# Patient Record
Sex: Female | Born: 1935 | Race: Black or African American | Hispanic: No | Marital: Married | Smoking: Never smoker
Health system: Southern US, Community
[De-identification: ages and names within clinical notes are randomized; demographics above are authoritative.]

## PROBLEM LIST (undated history)

## (undated) DIAGNOSIS — I10 Essential (primary) hypertension: Secondary | ICD-10-CM

## (undated) DIAGNOSIS — M1990 Unspecified osteoarthritis, unspecified site: Secondary | ICD-10-CM

## (undated) DIAGNOSIS — I509 Heart failure, unspecified: Secondary | ICD-10-CM

## (undated) DIAGNOSIS — E119 Type 2 diabetes mellitus without complications: Secondary | ICD-10-CM

## (undated) HISTORY — PX: ABDOMINAL HYSTERECTOMY: SHX81

## (undated) HISTORY — PX: BUNIONECTOMY: SHX129

## (undated) HISTORY — PX: TONSILLECTOMY: SUR1361

## (undated) HISTORY — PX: ROTATOR CUFF REPAIR: SHX139

## (undated) HISTORY — PX: CARPAL TUNNEL RELEASE: SHX101

## (undated) HISTORY — PX: EYE SURGERY: SHX253

## (undated) HISTORY — PX: KNEE ARTHROSCOPY: SUR90

---

## 1998-07-31 ENCOUNTER — Encounter: Payer: Self-pay | Admitting: Ophthalmology

## 1998-08-03 ENCOUNTER — Ambulatory Visit (HOSPITAL_COMMUNITY): Admission: RE | Admit: 1998-08-03 | Discharge: 1998-08-03 | Payer: Self-pay | Admitting: Ophthalmology

## 1998-08-11 ENCOUNTER — Ambulatory Visit: Admission: RE | Admit: 1998-08-11 | Discharge: 1998-08-11 | Payer: Self-pay | Admitting: Family Medicine

## 1998-08-11 ENCOUNTER — Encounter: Payer: Self-pay | Admitting: Family Medicine

## 1999-01-11 ENCOUNTER — Ambulatory Visit (HOSPITAL_COMMUNITY): Admission: RE | Admit: 1999-01-11 | Discharge: 1999-01-11 | Payer: Self-pay | Admitting: Ophthalmology

## 1999-10-25 ENCOUNTER — Encounter: Admission: RE | Admit: 1999-10-25 | Discharge: 1999-10-25 | Payer: Self-pay | Admitting: Family Medicine

## 1999-10-25 ENCOUNTER — Encounter: Payer: Self-pay | Admitting: Family Medicine

## 2000-10-25 ENCOUNTER — Encounter: Admission: RE | Admit: 2000-10-25 | Discharge: 2000-10-25 | Payer: Self-pay | Admitting: Family Medicine

## 2000-10-25 ENCOUNTER — Encounter: Payer: Self-pay | Admitting: Family Medicine

## 2001-08-20 ENCOUNTER — Encounter: Payer: Self-pay | Admitting: Family Medicine

## 2001-08-20 ENCOUNTER — Encounter: Admission: RE | Admit: 2001-08-20 | Discharge: 2001-08-20 | Payer: Self-pay | Admitting: Family Medicine

## 2001-10-30 ENCOUNTER — Encounter: Admission: RE | Admit: 2001-10-30 | Discharge: 2001-10-30 | Payer: Self-pay | Admitting: Family Medicine

## 2001-10-30 ENCOUNTER — Encounter: Payer: Self-pay | Admitting: Family Medicine

## 2004-04-20 ENCOUNTER — Other Ambulatory Visit: Admission: RE | Admit: 2004-04-20 | Discharge: 2004-04-20 | Payer: Self-pay | Admitting: Family Medicine

## 2004-05-19 ENCOUNTER — Ambulatory Visit (HOSPITAL_COMMUNITY): Admission: RE | Admit: 2004-05-19 | Discharge: 2004-05-19 | Payer: Self-pay | Admitting: Gastroenterology

## 2004-05-19 ENCOUNTER — Encounter (INDEPENDENT_AMBULATORY_CARE_PROVIDER_SITE_OTHER): Payer: Self-pay | Admitting: Specialist

## 2008-12-30 ENCOUNTER — Emergency Department (HOSPITAL_COMMUNITY): Admission: EM | Admit: 2008-12-30 | Discharge: 2008-12-30 | Payer: Self-pay | Admitting: Emergency Medicine

## 2011-02-11 NOTE — Op Note (Signed)
NAME:  Catherine Vazquez, Catherine Vazquez                       ACCOUNT NO.:  [id number]   MEDICAL RECORD NO.:  [id number]                   PATIENT TYPE:  AMB   LOCATION:  ENDO                                 FACILITY:  [facility]   PHYSICIAN:  Anselmo Rod, M.D.               DATE OF BIRTH:  19-Feb-1925   DATE OF PROCEDURE:  05/19/2004  DATE OF DISCHARGE:                                 OPERATIVE REPORT   PROCEDURE:  Colonoscopy with snare polypectomy times one.   ENDOSCOPIST:  Anselmo Rod, M.D.   INSTRUMENT USED:  Olympus video colonoscope.   INDICATIONS FOR PROCEDURE:  This is a 77 year old African-American female  who is undergoing screening colonoscopy to rule out colonic polyps, masses,  etc.   PRE-PROCEDURE PREPARATION:  Informed consent was procured from the patient.  The patient fasted for eight hours prior to the procedure and prepped with a  bottle of magnesium citrate and a gallon of GoLYTELY the night prior to the  procedure.   PRE-PROCEDURE PHYSICAL:  VITAL SIGNS:  The patient has stable vital signs.  NECK:  Supple.  CHEST:  Clear to auscultation.  CARDIOVASCULAR:  S1 and S2 are regular.  ABDOMEN:  Soft with normal bowel sounds.   DESCRIPTION OF PROCEDURE:  The patient was placed in the left lateral  decubitus position and sedated with 60 mg of Demerol and 6 mg of Versed in  slow incremental doses. Once the patient was adequately sedated and  maintained on low flow oxygen and continuous cardiac monitoring, the Olympus  video colonoscope was advanced from the rectum to the cecum.  There was some  residual stool in the colon and multiple washings were done.  The  appendiceal orifice and the ileocecal valve were clearly visualized and  photographed. A flat polyp was snared from the mid right colon.  Prominent  internal hemorrhoids were seen on retroflexion.  There was some extrinsic  compression of the sigmoid, question of fibroids.   IMPRESSION:  1. Prominent internal  hemorrhoids.  2. Extrinsic compression of the rectosigmoid colon, question fibroids.  3. Flat polyp snared from the mid right colon.  4. Normal appearing transverse colon and cecum.   RECOMMENDATIONS:  1. Await pathology results.  2. Avoid non-steroidals including aspirin for the next four weeks.  3. Question of CT scan of the abdomen and pelvis to further evaluate the     abnormality noted in the rectosigmoid colon and extrinsic compression.  4. Outpatient follow-up in the next two weeks for further recommendations.                                               Anselmo Rod, M.D.    JNM/MEDQ  D:  05/19/2004  T:  05/19/2004  Job:  [id number]   cc:  Renaye Rakers, M.D.  [phone] [address]  [place]  [place] [address]  Fax: [phone]

## 2013-09-11 ENCOUNTER — Observation Stay (HOSPITAL_COMMUNITY)
Admission: EM | Admit: 2013-09-11 | Discharge: 2013-09-13 | Disposition: A | Discharge status: Home / Self Care | Payer: Medicare PPO | Attending: Internal Medicine | Admitting: Internal Medicine

## 2013-09-11 ENCOUNTER — Emergency Department (HOSPITAL_COMMUNITY): Payer: Medicare PPO

## 2013-09-11 ENCOUNTER — Encounter (HOSPITAL_COMMUNITY): Payer: Self-pay | Admitting: Emergency Medicine

## 2013-09-11 DIAGNOSIS — M129 Arthropathy, unspecified: Secondary | ICD-10-CM

## 2013-09-11 DIAGNOSIS — I10 Essential (primary) hypertension: Secondary | ICD-10-CM | POA: Diagnosis present

## 2013-09-11 DIAGNOSIS — R5383 Other fatigue: Secondary | ICD-10-CM | POA: Diagnosis present

## 2013-09-11 DIAGNOSIS — Z79899 Other long term (current) drug therapy: Secondary | ICD-10-CM | POA: Insufficient documentation

## 2013-09-11 DIAGNOSIS — M1990 Unspecified osteoarthritis, unspecified site: Secondary | ICD-10-CM | POA: Diagnosis present

## 2013-09-11 DIAGNOSIS — I5030 Unspecified diastolic (congestive) heart failure: Secondary | ICD-10-CM | POA: Diagnosis present

## 2013-09-11 DIAGNOSIS — R0609 Other forms of dyspnea: Principal | ICD-10-CM | POA: Diagnosis present

## 2013-09-11 DIAGNOSIS — R42 Dizziness and giddiness: Secondary | ICD-10-CM | POA: Insufficient documentation

## 2013-09-11 DIAGNOSIS — R5381 Other malaise: Secondary | ICD-10-CM | POA: Insufficient documentation

## 2013-09-11 DIAGNOSIS — R0989 Other specified symptoms and signs involving the circulatory and respiratory systems: Principal | ICD-10-CM | POA: Insufficient documentation

## 2013-09-11 HISTORY — DX: Unspecified osteoarthritis, unspecified site: M19.90

## 2013-09-11 LAB — BASIC METABOLIC PANEL
CO2: 29 mEq/L (ref 19–32)
Chloride: 106 mEq/L (ref 96–112)
Creatinine, Ser: 0.8 mg/dL (ref 0.50–1.10)
GFR calc Af Amer: 75 mL/min — ABNORMAL LOW (ref >90)
Glucose, Bld: 79 mg/dL (ref 70–99)
Potassium: 4.1 mEq/L (ref 3.5–5.1)

## 2013-09-11 LAB — CBC
HCT: 38.7 % (ref 36.0–46.0)
Hemoglobin: 13.3 g/dL (ref 12.0–15.0)
MCH: 33.3 pg (ref 26.0–34.0)
MCV: 97 fL (ref 78.0–100.0)
Platelets: 228 10*3/uL (ref 150–400)
RBC: 3.99 MIL/uL (ref 3.87–5.11)
WBC: 6 10*3/uL (ref 4.0–10.5)

## 2013-09-11 LAB — D-DIMER, QUANTITATIVE: D-Dimer, Quant: 0.82 ug/mL-FEU — ABNORMAL HIGH (ref 0.00–0.48)

## 2013-09-11 LAB — POCT I-STAT TROPONIN I: Troponin i, poc: 0 ng/mL (ref 0.00–0.08)

## 2013-09-11 LAB — GLUCOSE, CAPILLARY: Glucose-Capillary: 62 mg/dL — ABNORMAL LOW (ref 70–99)

## 2013-09-11 LAB — PRO B NATRIURETIC PEPTIDE: Pro B Natriuretic peptide (BNP): 134.4 pg/mL (ref 0–450)

## 2013-09-11 MED ORDER — IOHEXOL 350 MG/ML SOLN
80.0000 mL | Freq: Once | INTRAVENOUS | Status: AC | PRN
  Administered 2013-09-11: 80 mL via INTRAVENOUS

## 2013-09-11 MED ORDER — SODIUM CHLORIDE 0.9 % IV BOLUS (SEPSIS)
500.0000 mL | Freq: Once | INTRAVENOUS | Status: AC
  Administered 2013-09-11: 500 mL via INTRAVENOUS

## 2013-09-11 NOTE — ED Notes (Signed)
Pt presents today with c/o SOB and dizziness getting progressively worse x 2 weeks.  Pt states "no energy whatsoever".

## 2013-09-11 NOTE — ED Notes (Signed)
Patient transported to CT 

## 2013-09-11 NOTE — ED Provider Notes (Signed)
CSN: [id number]     Arrival date & time 09/11/13  1440 History   First MD Initiated Contact with Patient 09/11/13 1759     Chief Complaint  Patient presents with  . Shortness of Breath  . Dizziness   (Consider location/radiation/quality/duration/timing/severity/associated sxs/prior Treatment) HPI  This is an 79 yo with past medical history notable for arthritis who presents with shortness of breath and dizziness. Patient reports progressive shortness of breath in the last 2 weeks. She reports dyspnea on exertion but no orthopnea. She denies any chest pain during this time. She reports decreased energy level. She denies any fevers or cough. She denies any leg swelling or history of blood clots. Patient states that she feels lightheaded but denies any room spinning dizziness. She denies any focal weakness or numbness. She states that if she is lying still she feels "pretty good."  Past Medical History  Diagnosis Date  . Arthritis    Past Surgical History  Procedure Laterality Date  . Abdominal hysterectomy    . Tonsillectomy    . Bunionectomy    . Carpal tunnel release    . Rotator cuff repair    . Knee arthroscopy    . Eye surgery     No family history on file. History  Substance Use Topics  . Smoking status: Never Smoker   . Smokeless tobacco: Not on file  . Alcohol Use: No   OB History   Grav Para Term Preterm Abortions TAB SAB Ect Mult Living                 Review of Systems  Constitutional: Negative for fever.  Respiratory: Positive for shortness of breath. Negative for cough and chest tightness.   Cardiovascular: Negative for chest pain and leg swelling.  Gastrointestinal: Negative for nausea, vomiting and abdominal pain.  Genitourinary: Negative for dysuria.  Musculoskeletal: Negative for back pain.  Skin: Negative for wound.  Neurological: Positive for dizziness, weakness and light-headedness. Negative for headaches.  Psychiatric/Behavioral: Negative for  confusion.  All other systems reviewed and are negative.    Allergies  Review of patient's allergies indicates no known allergies.  Home Medications   Current Outpatient Rx  Name  Route  Sig  Dispense  Refill  . cyanocobalamin 1000 MCG tablet   Oral   Take 100 mcg by mouth daily.         . Diclofenac-Misoprostol 75-0.2 MG TBEC   Oral   Take 1 tablet by mouth 2 (two) times daily.         . Misc Natural Products (OSTEO BI-FLEX/5-LOXIN ADVANCED PO)   Oral   Take 1 tablet by mouth daily.         . Multiple Vitamins-Minerals (CENTRUM SILVER PO)   Oral   Take 1 tablet by mouth daily.          BP 157/81  Pulse 64  Temp(Src) 98.4 F (36.9 C) (Oral)  Resp 18  Ht 5\' 5"  (1.651 m)  Wt 169 lb (76.658 kg)  BMI 28.12 kg/m2  SpO2 99% Physical Exam  Nursing note and vitals reviewed. Constitutional: She is oriented to person, place, and time. She appears well-developed and well-nourished. No distress.  Elderly  HENT:  Head: Normocephalic and atraumatic.  Mouth/Throat: Oropharynx is clear and moist.  Eyes: EOM are normal. Pupils are equal, round, and reactive to light.  Neck: Neck supple.  Cardiovascular: Normal rate, regular rhythm and normal heart sounds.   No murmur heard. Pulmonary/Chest: Effort normal  and breath sounds normal. No respiratory distress. She has no wheezes. She has no rales.  Abdominal: Soft. There is no tenderness.  Musculoskeletal: She exhibits no edema.  Neurological: She is alert and oriented to person, place, and time.  5 out of 5 strength in all 4 extremities, no dysmetria noted, cranial nerves II through XII intact  Skin: Skin is warm and dry.  Psychiatric: She has a normal mood and affect.    ED Course  Procedures (including critical care time) Labs Review Labs Reviewed  BASIC METABOLIC PANEL - Abnormal; Notable for the following:    GFR calc non Af Amer 65 (*)    GFR calc Af Amer 75 (*)    All other components within normal limits   GLUCOSE, CAPILLARY - Abnormal; Notable for the following:    Glucose-Capillary 62 (*)    All other components within normal limits  D-DIMER, QUANTITATIVE - Abnormal; Notable for the following:    D-Dimer, Quant 0.82 (*)    All other components within normal limits  CBC  PRO B NATRIURETIC PEPTIDE  GLUCOSE, CAPILLARY  POCT I-STAT TROPONIN I   Imaging Review Dg Chest 2 View (if Patient Has Fever And/or Copd)  09/11/2013   CLINICAL DATA:  Shortness of breath, dizziness  EXAM: CHEST  2 VIEW  COMPARISON:  None.  FINDINGS: Cardiomediastinal silhouette is unremarkable. No acute infiltrate or pulmonary edema. Degenerative changes thoracic spine.  IMPRESSION: No active disease.  Degenerative changes thoracic spine.   Electronically Signed   By: Natasha Mead M.D.   On: 09/11/2013 16:22   Ct Head Wo Contrast  09/11/2013   CLINICAL DATA:  Shortness of breath, dizziness  EXAM: CT HEAD WITHOUT CONTRAST  TECHNIQUE: Contiguous axial images were obtained from the base of the skull through the vertex without intravenous contrast.  COMPARISON:  None.  FINDINGS: No skull fracture is noted. Paranasal sinuses are unremarkable. There is partial opacification of left mastoid air cells.  No intracranial hemorrhage, mass effect or midline shift. Atherosclerotic calcifications of carotid siphon. Mild cerebral atrophy. Periventricular and patchy subcortical white matter decreased attenuation probable due to chronic small vessel ischemic changes. No definite acute cortical infarction. Probable small lacunar infarct in right frontal periventricular white matter. No mass lesion is noted on this unenhanced scan.  IMPRESSION: No acute intracranial abnormality. Mild cerebral atrophy. Periventricular and patchy subcortical white matter decreased attenuation probable due to chronic small vessel ischemic changes. No definite acute cortical infarction. Probable small lacunar infarct in right frontal periventricular white matter.    Electronically Signed   By: Natasha Mead M.D.   On: 09/11/2013 18:42   Ct Angio Chest W/cm &/or Wo Cm  09/11/2013   CLINICAL DATA:  Shortness of breath, dizziness  EXAM: CT ANGIOGRAPHY CHEST WITH CONTRAST  TECHNIQUE: Multidetector CT imaging of the chest was performed using the standard protocol during bolus administration of intravenous contrast. Multiplanar CT image reconstructions including MIPs were obtained to evaluate the vascular anatomy.  CONTRAST:  80mL OMNIPAQUE IOHEXOL 350 MG/ML SOLN  COMPARISON:  None  FINDINGS: Visualized portion of upper abdomen unremarkable.  Aorta normal caliber when height aneurysm or dissection.  Pulmonary arteries patent.  No evidence of pulmonary embolism.  No thoracic adenopathy.  Scattered interstitial thickening and mosaic attenuation in the lungs with dependent atelectasis at the lower lobes.  No pleural effusion, pneumothorax or pulmonary mass/ nodule.  Scattered degenerative disc disease changes thoracic spine without acute osseous findings.  Review of the MIP images confirms the  above findings.  IMPRESSION: No evidence of pulmonary embolism.  Scattered atelectasis, interstitial thickening, and mosaic attenuation in both lungs, nonspecific.   Electronically Signed   By: Ulyses Southward M.D.   On: 09/11/2013 21:00    EKG Interpretation    Date/Time:  Wednesday September 11 2013 14:47:38 EST Ventricular Rate:  68 PR Interval:  216 QRS Duration: 126 QT Interval:  398 QTC Calculation: 423 R Axis:   69 Text Interpretation:  Sinus rhythm with 1st degree A-V block Right bundle branch block New since previous tracing Abnormal ECG Confirmed by Javaun Dimperio  MD, Buster Schueller ([id number]) on 09/11/2013 9:53:37 PM            MDM   1. Dyspnea on exertion   2. Arthritis   3. Fatigue   4. HBP (high blood pressure)    Patient presents with dyspnea on exertion. She is nontoxic-appearing on exam. Pulmonary exam is reassuring. The patient has not had any chest pain. Labwork was  initiated including a d-dimer. She is low risk but given her age she does not meet per criteria. Number was elevated. CT scan of the chest is negative for clot. Patient's troponin is negative. EKG shows a right bundle branch block which is new from prior. Other workup is negative. Have concerns that patient's dyspnea on exertion may be an anginal equivalent. Discussed with the hospitalist who will admit for cardiac rule out.    Shon Baton, MD 09/12/13 [phone]

## 2013-09-11 NOTE — ED Notes (Signed)
Gave pt a cup of orange juice to drink. Will recheck blood sugar once pt drinks orange juice.

## 2013-09-11 NOTE — ED Notes (Signed)
02 dropped to 88% while walking. HR stayed in the 70.

## 2013-09-11 NOTE — ED Notes (Signed)
CBG 86. 

## 2013-09-12 DIAGNOSIS — I10 Essential (primary) hypertension: Secondary | ICD-10-CM | POA: Diagnosis present

## 2013-09-12 DIAGNOSIS — M1990 Unspecified osteoarthritis, unspecified site: Secondary | ICD-10-CM | POA: Diagnosis present

## 2013-09-12 DIAGNOSIS — R5383 Other fatigue: Secondary | ICD-10-CM | POA: Diagnosis present

## 2013-09-12 LAB — GLUCOSE, CAPILLARY
Glucose-Capillary: 88 mg/dL (ref 70–99)
Glucose-Capillary: 119 mg/dL — ABNORMAL HIGH (ref 70–99)

## 2013-09-12 LAB — TROPONIN I
Troponin I: <0.3 ng/mL (ref <0.30)
Troponin I: <0.3 ng/mL (ref <0.30)

## 2013-09-12 MED ORDER — ASPIRIN EC 325 MG PO TBEC
325.0000 mg | DELAYED_RELEASE_TABLET | Freq: Every day | ORAL | Status: DC
  Administered 2013-09-12 – 2013-09-13 (×2): 325 mg via ORAL
  Filled 2013-09-12 (×2): qty 1

## 2013-09-12 MED ORDER — ONDANSETRON HCL 4 MG/2ML IJ SOLN: 4.0000 mg | Freq: Four times a day (QID) | INTRAMUSCULAR | Status: DC | PRN

## 2013-09-12 MED ORDER — ACETAMINOPHEN 325 MG PO TABS: 650.0000 mg | ORAL_TABLET | ORAL | Status: DC | PRN

## 2013-09-12 NOTE — H&P (Signed)
PCP:   Altamese [place], MD   Chief Complaint:  Sob  HPI: 79 yo pleasant female comes in with 2 weeks of worsening doe.  Has been feeling very tired and worn out lately, and gets short of breath with any activity.  No le edema or swelling.  No fevers.  No cough.  No cp.  No recent uri symptoms.  No n/v/d.  Gets dizzy sometimes but loc.  No prior h/o chf but says shes had a heart attack before.  On minimal meds at home.  Review of Systems:  Positive and negative as per HPI otherwise all other systems are negative  Past Medical History: Past Medical History  Diagnosis Date  . Arthritis    Past Surgical History  Procedure Laterality Date  . Abdominal hysterectomy    . Tonsillectomy    . Bunionectomy    . Carpal tunnel release    . Rotator cuff repair    . Knee arthroscopy    . Eye surgery      Medications: Prior to Admission medications   Medication Sig Start Date End Date Taking? Authorizing Provider  cyanocobalamin 1000 MCG tablet Take 100 mcg by mouth daily.   Yes Historical Provider, MD  Diclofenac-Misoprostol 75-0.2 MG TBEC Take 1 tablet by mouth 2 (two) times daily.   Yes Historical Provider, MD  Misc Natural Products (OSTEO BI-FLEX/5-LOXIN ADVANCED PO) Take 1 tablet by mouth daily.   Yes Historical Provider, MD  Multiple Vitamins-Minerals (CENTRUM SILVER PO) Take 1 tablet by mouth daily.   Yes Historical Provider, MD    Allergies:  No Known Allergies  Social History:  reports that she has never smoked. She does not have any smokeless tobacco history on file. She reports that she does not drink alcohol or use illicit drugs.  Family History: none  Physical Exam: Filed Vitals:   09/11/13 2200 09/11/13 2215 09/11/13 2230 09/11/13 2315  BP: 155/61 152/68 165/67 157/81  Pulse: 65 46 58 64  Temp:      TempSrc:      Resp:      Height:      Weight:      SpO2: 100% 94% 100% 99%   General appearance: alert, cooperative and no distress Head: Normocephalic, without  obvious abnormality, atraumatic Eyes: negative Nose: Nares normal. Septum midline. Mucosa normal. No drainage or sinus tenderness. Neck: no JVD and supple, symmetrical, trachea midline Lungs: clear to auscultation bilaterally Heart: regular rate and rhythm, S1, S2 normal, no murmur, click, rub or gallop Abdomen: soft, non-tender; bowel sounds normal; no masses,  no organomegaly Extremities: extremities normal, atraumatic, no cyanosis or edema Pulses: 2+ and symmetric Skin: Skin color, texture, turgor normal. No rashes or lesions Neurologic: Grossly normal    Labs on Admission:   Recent Labs  09/11/13 1500  NA 144  K 4.1  CL 106  CO2 29  GLUCOSE 79  BUN 19  CREATININE 0.80  CALCIUM 9.6    Recent Labs  09/11/13 1500  WBC 6.0  HGB 13.3  HCT 38.7  MCV 97.0  PLT 228    Radiological Exams on Admission: Dg Chest 2 View (if Patient Has Fever And/or Copd)  09/11/2013   CLINICAL DATA:  Shortness of breath, dizziness  EXAM: CHEST  2 VIEW  COMPARISON:  None.  FINDINGS: Cardiomediastinal silhouette is unremarkable. No acute infiltrate or pulmonary edema. Degenerative changes thoracic spine.  IMPRESSION: No active disease.  Degenerative changes thoracic spine.   Electronically Signed   By: Lang Snow  Pop M.D.   On: 09/11/2013 16:22   Ct Head Wo Contrast  09/11/2013   CLINICAL DATA:  Shortness of breath, dizziness  EXAM: CT HEAD WITHOUT CONTRAST  TECHNIQUE: Contiguous axial images were obtained from the base of the skull through the vertex without intravenous contrast.  COMPARISON:  None.  FINDINGS: No skull fracture is noted. Paranasal sinuses are unremarkable. There is partial opacification of left mastoid air cells.  No intracranial hemorrhage, mass effect or midline shift. Atherosclerotic calcifications of carotid siphon. Mild cerebral atrophy. Periventricular and patchy subcortical white matter decreased attenuation probable due to chronic small vessel ischemic changes. No definite  acute cortical infarction. Probable small lacunar infarct in right frontal periventricular white matter. No mass lesion is noted on this unenhanced scan.  IMPRESSION: No acute intracranial abnormality. Mild cerebral atrophy. Periventricular and patchy subcortical white matter decreased attenuation probable due to chronic small vessel ischemic changes. No definite acute cortical infarction. Probable small lacunar infarct in right frontal periventricular white matter.   Electronically Signed   By: Natasha Mead M.D.   On: 09/11/2013 18:42   Ct Angio Chest W/cm &/or Wo Cm  09/11/2013   CLINICAL DATA:  Shortness of breath, dizziness  EXAM: CT ANGIOGRAPHY CHEST WITH CONTRAST  TECHNIQUE: Multidetector CT imaging of the chest was performed using the standard protocol during bolus administration of intravenous contrast. Multiplanar CT image reconstructions including MIPs were obtained to evaluate the vascular anatomy.  CONTRAST:  80mL OMNIPAQUE IOHEXOL 350 MG/ML SOLN  COMPARISON:  None  FINDINGS: Visualized portion of upper abdomen unremarkable.  Aorta normal caliber when height aneurysm or dissection.  Pulmonary arteries patent.  No evidence of pulmonary embolism.  No thoracic adenopathy.  Scattered interstitial thickening and mosaic attenuation in the lungs with dependent atelectasis at the lower lobes.  No pleural effusion, pneumothorax or pulmonary mass/ nodule.  Scattered degenerative disc disease changes thoracic spine without acute osseous findings.  Review of the MIP images confirms the above findings.  IMPRESSION: No evidence of pulmonary embolism.  Scattered atelectasis, interstitial thickening, and mosaic attenuation in both lungs, nonspecific.   Electronically Signed   By: Ulyses Southward M.D.   On: 09/11/2013 21:00    Assessment/Plan  79 yo female with doe of unclear etiology  Principal Problem:   DOE (dyspnea on exertion)- could be anginal equivilant.  obs for romi.  ekg nsr no acute changes.  Obtain 2 D  echo in am.  Will need further outpt stress testing.  Asa.  No evidence of pna or chf at this time.  No history to indicate underlying pulmonology source, so will do cardiac w/u first.  No h/o htn but multiple elevated bp here.  Active Problems:   Fatigue   Arthritis   HBP (high blood pressure)    Nochum Fenter A 09/12/2013, 12:29 AM

## 2013-09-12 NOTE — Progress Notes (Addendum)
[facility] PROGRESS NOTE  Catherine Vazquez GNF:[id number] DOB: 09-Sep-1925 DOA: 09/11/2013 PCP: Altamese [place], MD  Assessment/Plan: DOE (dyspnea on exertion)- ? anginal equivilant.  -ekg nsr no acute changes -CE  -2 D echo in am.  -? further outpt stress testing.  -Asa.  -check TSH  HTN -d/c in AM   Code Status: full Family Communication: daughter at bedside Disposition Plan: home in AM   Consultants:  none  Procedures:  echo  Antibiotics:    HPI/Subjective: Feeling better Wants to go home  Objective: Filed Vitals:   09/12/13 0452  BP: 146/54  Pulse: 59  Temp: 97.6 F (36.4 C)  Resp: 16    Intake/Output Summary (Last 24 hours) at 09/12/13 1220 Last data filed at 09/12/13 0900  Gross per 24 hour  Intake    240 ml  Output    275 ml  Net    -35 ml   Filed Weights   09/11/13 1457 09/12/13 0129  Weight: 76.658 kg (169 lb) 76.159 kg (167 lb 14.4 oz)    Exam:   General:  A+Ox3, NAD   Cardiovascular: rrr  Respiratory: clear anterior, no wheezing  Abdomen: +BS, soft, NT  Musculoskeletal: moves all 4 ext   Data Reviewed: Basic Metabolic Panel:  Recent Labs Lab 09/11/13 1500  NA 144  K 4.1  CL 106  CO2 29  GLUCOSE 79  BUN 19  CREATININE 0.80  CALCIUM 9.6   Liver Function Tests: No results found for this basename: AST, ALT, ALKPHOS, BILITOT, PROT, ALBUMIN,  in the last 168 hours No results found for this basename: LIPASE, AMYLASE,  in the last 168 hours No results found for this basename: AMMONIA,  in the last 168 hours CBC:  Recent Labs Lab 09/11/13 1500  WBC 6.0  HGB 13.3  HCT 38.7  MCV 97.0  PLT 228   Cardiac Enzymes:  Recent Labs Lab 09/12/13 0205 09/12/13 0805  TROPONINI <0.30 <0.30   BNP (last 3 results)  Recent Labs  09/11/13 1844  PROBNP 134.4   CBG:  Recent Labs Lab 09/11/13 1808 09/11/13 1911 09/12/13 0134 09/12/13 0751 09/12/13 1140  GLUCAP 62* 86 173* 88 103*    No results found  for this or any previous visit (from the past 240 hour(s)).   Studies: Dg Chest 2 View (if Patient Has Fever And/or Copd)  09/11/2013   CLINICAL DATA:  Shortness of breath, dizziness  EXAM: CHEST  2 VIEW  COMPARISON:  None.  FINDINGS: Cardiomediastinal silhouette is unremarkable. No acute infiltrate or pulmonary edema. Degenerative changes thoracic spine.  IMPRESSION: No active disease.  Degenerative changes thoracic spine.   Electronically Signed   By: Natasha Mead M.D.   On: 09/11/2013 16:22   Ct Head Wo Contrast  09/11/2013   CLINICAL DATA:  Shortness of breath, dizziness  EXAM: CT HEAD WITHOUT CONTRAST  TECHNIQUE: Contiguous axial images were obtained from the base of the skull through the vertex without intravenous contrast.  COMPARISON:  None.  FINDINGS: No skull fracture is noted. Paranasal sinuses are unremarkable. There is partial opacification of left mastoid air cells.  No intracranial hemorrhage, mass effect or midline shift. Atherosclerotic calcifications of carotid siphon. Mild cerebral atrophy. Periventricular and patchy subcortical white matter decreased attenuation probable due to chronic small vessel ischemic changes. No definite acute cortical infarction. Probable small lacunar infarct in right frontal periventricular white matter. No mass lesion is noted on this unenhanced scan.  IMPRESSION: No acute intracranial abnormality. Mild cerebral atrophy.  Periventricular and patchy subcortical white matter decreased attenuation probable due to chronic small vessel ischemic changes. No definite acute cortical infarction. Probable small lacunar infarct in right frontal periventricular white matter.   Electronically Signed   By: Natasha Mead M.D.   On: 09/11/2013 18:42   Ct Angio Chest W/cm &/or Wo Cm  09/11/2013   CLINICAL DATA:  Shortness of breath, dizziness  EXAM: CT ANGIOGRAPHY CHEST WITH CONTRAST  TECHNIQUE: Multidetector CT imaging of the chest was performed using the standard protocol  during bolus administration of intravenous contrast. Multiplanar CT image reconstructions including MIPs were obtained to evaluate the vascular anatomy.  CONTRAST:  80mL OMNIPAQUE IOHEXOL 350 MG/ML SOLN  COMPARISON:  None  FINDINGS: Visualized portion of upper abdomen unremarkable.  Aorta normal caliber when height aneurysm or dissection.  Pulmonary arteries patent.  No evidence of pulmonary embolism.  No thoracic adenopathy.  Scattered interstitial thickening and mosaic attenuation in the lungs with dependent atelectasis at the lower lobes.  No pleural effusion, pneumothorax or pulmonary mass/ nodule.  Scattered degenerative disc disease changes thoracic spine without acute osseous findings.  Review of the MIP images confirms the above findings.  IMPRESSION: No evidence of pulmonary embolism.  Scattered atelectasis, interstitial thickening, and mosaic attenuation in both lungs, nonspecific.   Electronically Signed   By: Ulyses Southward M.D.   On: 09/11/2013 21:00    Scheduled Meds: . aspirin EC  325 mg Oral Daily   Continuous Infusions:   Principal Problem:   DOE (dyspnea on exertion) Active Problems:   Fatigue   Arthritis   HBP (high blood pressure)    Time spent: 35 min    Catherine Vazquez  [facility] Pager [phone]. If 7PM-7AM, please contact night-coverage at [email or web address], password [facility] 09/12/2013, 12:20 PM  LOS: 1 day

## 2013-09-12 NOTE — Progress Notes (Signed)
Echocardiogram 2D Echocardiogram has been performed.  Catherine Vazquez 09/12/2013, 1:06 PM

## 2013-09-12 NOTE — Progress Notes (Signed)
UR completed 

## 2013-09-13 DIAGNOSIS — I5030 Unspecified diastolic (congestive) heart failure: Secondary | ICD-10-CM | POA: Diagnosis present

## 2013-09-13 DIAGNOSIS — I509 Heart failure, unspecified: Secondary | ICD-10-CM

## 2013-09-13 LAB — GLUCOSE, CAPILLARY: Glucose-Capillary: 104 mg/dL — ABNORMAL HIGH (ref 70–99)

## 2013-09-13 MED ORDER — FUROSEMIDE 20 MG PO TABS: 20.0000 mg | ORAL_TABLET | Freq: Every day | ORAL | Status: DC

## 2013-09-13 NOTE — Discharge Summary (Addendum)
Physician Discharge Summary  Catherine Vazquez:[id number] DOB: 01/29/1925 DOA: 09/11/2013  PCP: Altamese [place], MD  Admit date: 09/11/2013 Discharge date: 09/13/2013  Time spent:  Recommendations for Outpatient Follow-up:  1. Discharge home with outpatient PCP followup.  Discharge Diagnoses:  Principal Problem: Diastolic CHF     Active Problems: DOE (dyspnea on exertion)   Fatigue   Arthritis   HBP (high blood pressure)   Discharge Condition: Fair  Diet recommendation: Low sodium diet  Filed Weights   09/11/13 1457 09/12/13 0129 09/13/13 0649  Weight: 76.658 kg (169 lb) 76.159 kg (167 lb 14.4 oz) 75.796 kg (167 lb 1.6 oz)    History of present illness:  Please refer to admission H&P for details, but in brief, 79 year old female with history of arthritis presented with 2 weeks of worsening dyspnea on exertion which progressed to dyspnea with minimal activity. She denied any thick swellings, orthopnea or PND. Denies any fever, recent travel or chest pain symptoms. She denied any nausea vomiting or diarrhea. She did report feeling  dizzy as well. Patient admitted for further workup.  Hospital Course:  Dyspnea on exertion Symptoms likely related to grade 2 diastolic dysfunction seen on 2-D echo. She however did not have acute CHF symptoms on presentation. A proBNP was normal. Chest x-ray and CT angiogram of the chest (given elevated d-dimer) were both negative. Her symptoms have now improved. I will discharge her on low dose of Lasix 20 mg once daily and have her followup as outpatient. Her dizziness has now resolved. -i have ordered TSH which can be followed as outpatient.   Elevated blood pressure Her systolic blood pressure is mildly elevated. I am discharging her on low dose Lasix beginning of depression needs to be monitored as outpatient and if persistently elevated , her Lasix dose should be increased or started on another blood pressure  medication.  Arthritis Continue NSAIDs. Monitor renal fn while on both NSAIDs and lasix.  Patient is stable for discharge home with outpatient followup daughter-in-law  was informed over the form  Procedures:  2D echo  Consultations:  none  Discharge Exam: Filed Vitals:   09/13/13 0649  BP: 144/67  Pulse: 57  Temp: 97.8 F (36.6 C)  Resp:     General: Elderly female in no acute distress HEENT: No pallor, moist oral mucosa Chest: Clear to auscultation bilaterally, no added sounds CVS: Normal S1 and S2, no murmurs rub or gallop Abdomen: Soft, nontender, nondistended, bowel sounds present Extremities: Warm, no edema CNS: AAO x3  Discharge Instructions     Medication List         CENTRUM SILVER PO  Take 1 tablet by mouth daily.     cyanocobalamin 1000 MCG tablet  Take 100 mcg by mouth daily.     Diclofenac-Misoprostol 75-0.2 MG Tbec  Take 1 tablet by mouth 2 (two) times daily.     furosemide 20 MG tablet  Commonly known as:  LASIX  Take 1 tablet (20 mg total) by mouth daily.     OSTEO BI-FLEX/5-LOXIN ADVANCED PO  Take 1 tablet by mouth daily.       No Known Allergies     Follow-up Information   Follow up with Altamese [place], MD In 1 week.   Specialty:  Family Medicine   Contact information:   [address] [place] [address] [phone]        The results of significant diagnostics from this hospitalization (including imaging, microbiology, ancillary and laboratory)  are listed below for reference.    Significant Diagnostic Studies: Dg Chest 2 View (if Patient Has Fever And/or Copd)  09/11/2013   CLINICAL DATA:  Shortness of breath, dizziness  EXAM: CHEST  2 VIEW  COMPARISON:  None.  FINDINGS: Cardiomediastinal silhouette is unremarkable. No acute infiltrate or pulmonary edema. Degenerative changes thoracic spine.  IMPRESSION: No active disease.  Degenerative changes thoracic spine.   Electronically Signed   By: Natasha Mead  M.D.   On: 09/11/2013 16:22   Ct Head Wo Contrast  09/11/2013   CLINICAL DATA:  Shortness of breath, dizziness  EXAM: CT HEAD WITHOUT CONTRAST  TECHNIQUE: Contiguous axial images were obtained from the base of the skull through the vertex without intravenous contrast.  COMPARISON:  None.  FINDINGS: No skull fracture is noted. Paranasal sinuses are unremarkable. There is partial opacification of left mastoid air cells.  No intracranial hemorrhage, mass effect or midline shift. Atherosclerotic calcifications of carotid siphon. Mild cerebral atrophy. Periventricular and patchy subcortical white matter decreased attenuation probable due to chronic small vessel ischemic changes. No definite acute cortical infarction. Probable small lacunar infarct in right frontal periventricular white matter. No mass lesion is noted on this unenhanced scan.  IMPRESSION: No acute intracranial abnormality. Mild cerebral atrophy. Periventricular and patchy subcortical white matter decreased attenuation probable due to chronic small vessel ischemic changes. No definite acute cortical infarction. Probable small lacunar infarct in right frontal periventricular white matter.   Electronically Signed   By: Natasha Mead M.D.   On: 09/11/2013 18:42   Ct Angio Chest W/cm &/or Wo Cm  09/11/2013   CLINICAL DATA:  Shortness of breath, dizziness  EXAM: CT ANGIOGRAPHY CHEST WITH CONTRAST  TECHNIQUE: Multidetector CT imaging of the chest was performed using the standard protocol during bolus administration of intravenous contrast. Multiplanar CT image reconstructions including MIPs were obtained to evaluate the vascular anatomy.  CONTRAST:  80mL OMNIPAQUE IOHEXOL 350 MG/ML SOLN  COMPARISON:  None  FINDINGS: Visualized portion of upper abdomen unremarkable.  Aorta normal caliber when height aneurysm or dissection.  Pulmonary arteries patent.  No evidence of pulmonary embolism.  No thoracic adenopathy.  Scattered interstitial thickening and mosaic  attenuation in the lungs with dependent atelectasis at the lower lobes.  No pleural effusion, pneumothorax or pulmonary mass/ nodule.  Scattered degenerative disc disease changes thoracic spine without acute osseous findings.  Review of the MIP images confirms the above findings.  IMPRESSION: No evidence of pulmonary embolism.  Scattered atelectasis, interstitial thickening, and mosaic attenuation in both lungs, nonspecific.   Electronically Signed   By: Ulyses Southward M.D.   On: 09/11/2013 21:00    Microbiology: No results found for this or any previous visit (from the past 240 hour(s)).   Labs: Basic Metabolic Panel:  Recent Labs Lab 09/11/13 1500  NA 144  K 4.1  CL 106  CO2 29  GLUCOSE 79  BUN 19  CREATININE 0.80  CALCIUM 9.6   Liver Function Tests: No results found for this basename: AST, ALT, ALKPHOS, BILITOT, PROT, ALBUMIN,  in the last 168 hours No results found for this basename: LIPASE, AMYLASE,  in the last 168 hours No results found for this basename: AMMONIA,  in the last 168 hours CBC:  Recent Labs Lab 09/11/13 1500  WBC 6.0  HGB 13.3  HCT 38.7  MCV 97.0  PLT 228   Cardiac Enzymes:  Recent Labs Lab 09/12/13 0205 09/12/13 0805 09/12/13 1354  TROPONINI <0.30 <0.30 <0.30   BNP:  BNP (last 3 results)  Recent Labs  09/11/13 1844  PROBNP 134.4   CBG:  Recent Labs Lab 09/12/13 0751 09/12/13 1140 09/12/13 1625 09/12/13 2152 09/13/13 0759  GLUCAP 88 103* 119* 104* 104*       Signed:  Demitrios Molyneux  [facility] 09/13/2013, 11:10 AM

## 2015-02-08 ENCOUNTER — Emergency Department (HOSPITAL_COMMUNITY)
Admission: EM | Admit: 2015-02-08 | Discharge: 2015-02-08 | Disposition: A | Discharge status: Home / Self Care | Payer: Medicare PPO | Attending: Emergency Medicine | Admitting: Emergency Medicine

## 2015-02-08 ENCOUNTER — Encounter (HOSPITAL_COMMUNITY): Payer: Self-pay | Admitting: Emergency Medicine

## 2015-02-08 DIAGNOSIS — Z79899 Other long term (current) drug therapy: Secondary | ICD-10-CM | POA: Insufficient documentation

## 2015-02-08 DIAGNOSIS — M1990 Unspecified osteoarthritis, unspecified site: Secondary | ICD-10-CM | POA: Diagnosis not present

## 2015-02-08 DIAGNOSIS — H6093 Unspecified otitis externa, bilateral: Secondary | ICD-10-CM

## 2015-02-08 DIAGNOSIS — H9203 Otalgia, bilateral: Secondary | ICD-10-CM | POA: Diagnosis present

## 2015-02-08 MED ORDER — CIPROFLOXACIN-DEXAMETHASONE 0.3-0.1 % OT SUSP
4.0000 [drp] | Freq: Two times a day (BID) | OTIC | Status: AC
  Administered 2015-02-08: 4 [drp] via OTIC
  Filled 2015-02-08: qty 7.5

## 2015-02-08 MED ORDER — IBUPROFEN 800 MG PO TABS: 800.0000 mg | ORAL_TABLET | Freq: Three times a day (TID) | ORAL | Status: DC

## 2015-02-08 MED ORDER — AMOXICILLIN-POT CLAVULANATE 875-125 MG PO TABS: 1.0000 | ORAL_TABLET | Freq: Two times a day (BID) | ORAL | Status: DC

## 2015-02-08 NOTE — ED Provider Notes (Signed)
CSN: [id number]     Arrival date & time 02/08/15  1402 History   First MD Initiated Contact with Patient 02/08/15 1410     Chief Complaint  Patient presents with  . Otalgia  . Hearing Problem     (Consider location/radiation/quality/duration/timing/severity/associated sxs/prior Treatment) HPI Comments: 81 year old female, presents with bilateral ear pain right greater than left, has been ongoing for several weeks, gradually worsening, today her right ear pain was worse than the left, she has had a gradual decline in her ability to hear, she denies any fevers chills nausea vomiting vertigo tinnitus, neck pain, chest pain, shortness of breath or any other complaints. She has been dropping a "sweet oil" into her ear without improvement. She does clean her ears frequently and after using a Q-tip recently she noted a dark colored material that looked like "soot" coming out of her ear. There is no other ongoing drainage  Patient is a 81 y.o. female presenting with ear pain. The history is provided by the patient.  Otalgia   Past Medical History  Diagnosis Date  . Arthritis    Past Surgical History  Procedure Laterality Date  . Abdominal hysterectomy    . Tonsillectomy    . Bunionectomy    . Carpal tunnel release    . Rotator cuff repair    . Knee arthroscopy    . Eye surgery     No family history on file. History  Substance Use Topics  . Smoking status: Never Smoker   . Smokeless tobacco: Not on file  . Alcohol Use: No   OB History    No data available     Review of Systems  HENT: Positive for ear pain.   All other systems reviewed and are negative.     Allergies  Review of patient's allergies indicates no known allergies.  Home Medications   Prior to Admission medications   Medication Sig Start Date End Date Taking? Authorizing Provider  amoxicillin-clavulanate (AUGMENTIN) 875-125 MG per tablet Take 1 tablet by mouth every 12 (twelve) hours. 02/08/15   Eber Hong,  MD  cyanocobalamin 1000 MCG tablet Take 100 mcg by mouth daily.    Historical Provider, MD  Diclofenac-Misoprostol 75-0.2 MG TBEC Take 1 tablet by mouth 2 (two) times daily.    Historical Provider, MD  furosemide (LASIX) 20 MG tablet Take 1 tablet (20 mg total) by mouth daily. 09/13/13   Nishant Dhungel, MD  ibuprofen (ADVIL,MOTRIN) 800 MG tablet Take 1 tablet (800 mg total) by mouth 3 (three) times daily. 02/08/15   Eber Hong, MD  Misc Natural Products (OSTEO BI-FLEX/5-LOXIN ADVANCED PO) Take 1 tablet by mouth daily.    Historical Provider, MD  Multiple Vitamins-Minerals (CENTRUM SILVER PO) Take 1 tablet by mouth daily.    Historical Provider, MD   BP 148/78 mmHg  Pulse 66  Temp(Src) 98.6 F (37 C) (Oral)  Resp 17  Ht  (1.651 m)  Wt 174 lb (78.926 kg)  BMI 28.96 kg/m2  SpO2 96% Physical Exam  Constitutional: She appears well-developed and well-nourished.  HENT:  Head: Normocephalic and atraumatic.  Oropharynx is clear and moist, nasal passages are clear, tympanic membranes are obscured bilaterally by desquamation and edema.  With TM's with purulent material behind the TM.  No bleeding, pain with manipulation of the auricle and the tragus.  Eyes: Conjunctivae are normal. Right eye exhibits no discharge. Left eye exhibits no discharge.  Pulmonary/Chest: Effort normal. No respiratory distress.  Neurological: She is alert. Coordination  normal.  Skin: Skin is warm and dry. No rash noted. She is not diaphoretic. No erythema.  Psychiatric: She has a normal mood and affect.  Nursing note and vitals reviewed.   ED Course  Procedures (including critical care time) Labs Review Labs Reviewed - No data to display  Imaging Review No results found.    MDM   Final diagnoses:  Otitis external, bilateral    Bilateral OE, likely has OM bialterally, VS normal - meds as below.  Meds given in ED:  Medications  ciprofloxacin-dexamethasone (CIPRODEX) 0.3-0.1 % otic suspension 4  drop (not administered)    New Prescriptions   AMOXICILLIN-CLAVULANATE (AUGMENTIN) 875-125 MG PER TABLET    Take 1 tablet by mouth every 12 (twelve) hours.   IBUPROFEN (ADVIL,MOTRIN) 800 MG TABLET    Take 1 tablet (800 mg total) by mouth 3 (three) times daily.        Eber Hong, MD 02/08/15 [phone]

## 2015-02-08 NOTE — Discharge Instructions (Signed)
Please call your doctor for a followup appointment within 24-48 hours. When you talk to your doctor please let them know that you were seen in the emergency department and have them acquire all of your records so that they can discuss the findings with you and formulate a treatment plan to fully care for your new and ongoing problems. ° °

## 2015-02-08 NOTE — ED Notes (Signed)
Pt. Stated, My rt. Ear hurts and I can' hear out of either.

## 2015-11-13 ENCOUNTER — Encounter (HOSPITAL_COMMUNITY): Payer: Self-pay | Admitting: Family Medicine

## 2015-11-13 ENCOUNTER — Emergency Department (HOSPITAL_COMMUNITY): Payer: Medicare Other

## 2015-11-13 ENCOUNTER — Emergency Department (HOSPITAL_COMMUNITY)
Admission: EM | Admit: 2015-11-13 | Discharge: 2015-11-13 | Disposition: A | Discharge status: Home / Self Care | Payer: Medicare Other | Attending: Emergency Medicine | Admitting: Emergency Medicine

## 2015-11-13 DIAGNOSIS — Z7984 Long term (current) use of oral hypoglycemic drugs: Secondary | ICD-10-CM | POA: Insufficient documentation

## 2015-11-13 DIAGNOSIS — I509 Heart failure, unspecified: Secondary | ICD-10-CM | POA: Diagnosis not present

## 2015-11-13 DIAGNOSIS — R079 Chest pain, unspecified: Secondary | ICD-10-CM | POA: Diagnosis not present

## 2015-11-13 DIAGNOSIS — R0600 Dyspnea, unspecified: Secondary | ICD-10-CM | POA: Diagnosis not present

## 2015-11-13 DIAGNOSIS — Z7982 Long term (current) use of aspirin: Secondary | ICD-10-CM | POA: Diagnosis not present

## 2015-11-13 DIAGNOSIS — Z79899 Other long term (current) drug therapy: Secondary | ICD-10-CM | POA: Insufficient documentation

## 2015-11-13 DIAGNOSIS — Z791 Long term (current) use of non-steroidal anti-inflammatories (NSAID): Secondary | ICD-10-CM | POA: Diagnosis not present

## 2015-11-13 DIAGNOSIS — I10 Essential (primary) hypertension: Secondary | ICD-10-CM | POA: Insufficient documentation

## 2015-11-13 DIAGNOSIS — Z792 Long term (current) use of antibiotics: Secondary | ICD-10-CM | POA: Insufficient documentation

## 2015-11-13 DIAGNOSIS — R0602 Shortness of breath: Secondary | ICD-10-CM | POA: Diagnosis present

## 2015-11-13 DIAGNOSIS — R05 Cough: Secondary | ICD-10-CM | POA: Diagnosis not present

## 2015-11-13 DIAGNOSIS — E119 Type 2 diabetes mellitus without complications: Secondary | ICD-10-CM | POA: Diagnosis not present

## 2015-11-13 HISTORY — DX: Type 2 diabetes mellitus without complications: E11.9

## 2015-11-13 HISTORY — DX: Essential (primary) hypertension: I10

## 2015-11-13 HISTORY — DX: Heart failure, unspecified: I50.9

## 2015-11-13 LAB — BASIC METABOLIC PANEL
Anion gap: 9 (ref 5–15)
BUN: 17 mg/dL (ref 6–20)
CO2: 24 mmol/L (ref 22–32)
CREATININE: 0.85 mg/dL (ref 0.44–1.00)
Calcium: 9.3 mg/dL (ref 8.9–10.3)
Chloride: 106 mmol/L (ref 101–111)
GFR calc Af Amer: >60 mL/min (ref >60)
GFR calc non Af Amer: 59 mL/min — ABNORMAL LOW (ref >60)
GLUCOSE: 135 mg/dL — AB (ref 65–99)
Potassium: 3.5 mmol/L (ref 3.5–5.1)
Sodium: 139 mmol/L (ref 135–145)

## 2015-11-13 LAB — CBC
HCT: 36.6 % (ref 36.0–46.0)
HEMOGLOBIN: 12.5 g/dL (ref 12.0–15.0)
MCH: 32.6 pg (ref 26.0–34.0)
MCHC: 34.2 g/dL (ref 30.0–36.0)
MCV: 95.6 fL (ref 78.0–100.0)
PLATELETS: 235 10*3/uL (ref 150–400)
RBC: 3.83 MIL/uL — ABNORMAL LOW (ref 3.87–5.11)
RDW: 14 % (ref 11.5–15.5)
WBC: 4.4 10*3/uL (ref 4.0–10.5)

## 2015-11-13 LAB — I-STAT TROPONIN, ED: Troponin i, poc: 0 ng/mL (ref 0.00–0.08)

## 2015-11-13 LAB — BRAIN NATRIURETIC PEPTIDE: B Natriuretic Peptide: 57.9 pg/mL (ref 0.0–100.0)

## 2015-11-13 MED ORDER — IOHEXOL 350 MG/ML SOLN
100.0000 mL | Freq: Once | INTRAVENOUS | Status: AC | PRN
  Administered 2015-11-13: 100 mL via INTRAVENOUS

## 2015-11-13 NOTE — ED Notes (Signed)
Patient brought back to room and hook up. Family at bedside.

## 2015-11-13 NOTE — Discharge Instructions (Signed)

## 2015-11-13 NOTE — ED Notes (Signed)
Pt here for SOB, and productive cough. sts hurts in her chest when she coughs.

## 2015-11-17 NOTE — ED Provider Notes (Signed)
CSN: [id number]     Arrival date & time 11/13/15  1117 History   First MD Initiated Contact with Patient 11/13/15 1519     Chief Complaint  Patient presents with  . Shortness of Breath  . Cough     (Consider location/radiation/quality/duration/timing/severity/associated sxs/prior Treatment) HPI   82 year old female with cough and shortness of breath. Progressively worsening over the last 3-4 days. Cough is productive. She does have some pain along her costal margin when she coughs. No fevers or chills. No unusual leg pain or swelling. No sick contacts. Denies any orthopnea. No increased peripheral edema.  Past Medical History  Diagnosis Date  . Arthritis   . CHF (congestive heart failure) (HCC)   . Hypertension   . Diabetes mellitus without complication [facility])    Past Surgical History  Procedure Laterality Date  . Abdominal hysterectomy    . Tonsillectomy    . Bunionectomy    . Carpal tunnel release    . Rotator cuff repair    . Knee arthroscopy    . Eye surgery     History reviewed. No pertinent family history. Social History  Substance Use Topics  . Smoking status: Never Smoker   . Smokeless tobacco: None  . Alcohol Use: No   OB History    No data available     Review of Systems  All systems reviewed and negative, other than as noted in HPI.   Allergies  Review of patient's allergies indicates no known allergies.  Home Medications   Prior to Admission medications   Medication Sig Start Date End Date Taking? Authorizing Provider  acetaminophen (TYLENOL) 500 MG tablet Take 500 mg by mouth every 6 (six) hours as needed for moderate pain.   Yes Historical Provider, MD  amLODipine (NORVASC) 5 MG tablet Take 5 mg by mouth daily.   Yes Historical Provider, MD  aspirin EC 81 MG tablet Take 81 mg by mouth daily.   Yes Historical Provider, MD  carvedilol (COREG) 6.25 MG tablet Take 6.25 mg by mouth 2 (two) times daily with a meal.   Yes Historical Provider, MD   Diclofenac-Misoprostol 75-0.2 MG TBEC Take 1 tablet by mouth 2 (two) times daily.   Yes Historical Provider, MD  losartan (COZAAR) 100 MG tablet Take 100 mg by mouth daily.   Yes Historical Provider, MD  Misc Natural Products (OSTEO BI-FLEX/5-LOXIN ADVANCED PO) Take 1 tablet by mouth 2 (two) times daily.    Yes Historical Provider, MD  Multiple Vitamins-Minerals (CENTRUM SILVER PO) Take 1 tablet by mouth daily.   Yes Historical Provider, MD  sitaGLIPtin (JANUVIA) 25 MG tablet Take 25 mg by mouth daily.   Yes Historical Provider, MD  amoxicillin-clavulanate (AUGMENTIN) 875-125 MG per tablet Take 1 tablet by mouth every 12 (twelve) hours. 02/08/15   Eber Hong, MD  furosemide (LASIX) 20 MG tablet Take 1 tablet (20 mg total) by mouth daily. 09/13/13   Nishant Dhungel, MD  ibuprofen (ADVIL,MOTRIN) 800 MG tablet Take 1 tablet (800 mg total) by mouth 3 (three) times daily. 02/08/15   Eber Hong, MD   BP 157/63 mmHg  Pulse 64  Temp(Src) 98.2 F (36.8 C)  Resp 19  SpO2 96% Physical Exam  Constitutional: She appears well-developed and well-nourished. No distress.  HENT:  Head: Normocephalic and atraumatic.  Eyes: Conjunctivae are normal. Right eye exhibits no discharge. Left eye exhibits no discharge.  Neck: Neck supple.  Cardiovascular: Normal rate, regular rhythm and normal heart sounds.  Exam reveals no  gallop and no friction rub.   No murmur heard. Pulmonary/Chest: Effort normal and breath sounds normal. No respiratory distress.  Abdominal: Soft. She exhibits no distension. There is no tenderness.  Musculoskeletal: She exhibits no edema or tenderness.  Lower extremities symmetric as compared to each other. No calf tenderness. Negative Homan's. No palpable cords.   Neurological: She is alert.  Skin: Skin is warm and dry.  Psychiatric: She has a normal mood and affect. Her behavior is normal. Thought content normal.  Nursing note and vitals reviewed.   ED Course  Procedures (including  critical care time) Labs Review Labs Reviewed  BASIC METABOLIC PANEL - Abnormal; Notable for the following:    Glucose, Bld 135 (*)    GFR calc non Af Amer 59 (*)    All other components within normal limits  CBC - Abnormal; Notable for the following:    RBC 3.83 (*)    All other components within normal limits  BRAIN NATRIURETIC PEPTIDE  I-STAT TROPOININ, ED    Imaging Review No results found.   Dg Chest 2 View  11/13/2015  CLINICAL DATA:  Short of breath, productive cough EXAM: CHEST  2 VIEW COMPARISON:  Prior chest x-ray and chest CT 09/11/2013 FINDINGS: Stable mediastinal and cardiac contours. The aorta is tortuous. Chronic bronchitic change in mild interstitial prominence similar compared to prior. Chronic lingular scarring. No focal airspace consolidation, pleural effusion, pulmonary edema or pneumothorax. No suspicious nodule or mass. No acute osseous abnormality. Soft tissue anchor in the right humeral head suggest prior rotator cuff repair. Multilevel degenerative disc disease. IMPRESSION: Stable chest x-ray without evidence of acute cardiopulmonary disease. Electronically Signed   By: Malachy Moan M.D.   On: 11/13/2015 12:15   Ct Angio Chest Pe W/cm &/or Wo Cm  11/13/2015  CLINICAL DATA:  Cough 1 week getting worse. Bilateral rib pain with coughing. Shortness of breath 2 months getting worse. Shortness of breath on exertion. EXAM: CT ANGIOGRAPHY CHEST WITH CONTRAST TECHNIQUE: Multidetector CT imaging of the chest was performed using the standard protocol during bolus administration of intravenous contrast. Multiplanar CT image reconstructions and MIPs were obtained to evaluate the vascular anatomy. CONTRAST:  OMNIPAQUE IOHEXOL 350 MG/ML SOLN COMPARISON:  Chest x-ray 11/13/2015 and chest CT 09/11/2013 FINDINGS: Lungs are well inflated without focal consolidation or effusion. Subtle bibasilar dependent atelectasis. Subtle chronic peripheral interstitial prominence over the  lateral bases. Airways are within normal. Mild cardiomegaly. Pulmonary arterial system is within normal without evidence of emboli. No evidence of mediastinal, hilar or axillary adenopathy. Superior pericardial recess unchanged. Remaining mediastinal structures are within normal. Images through the upper abdomen are within normal. There are moderate degenerative changes of the spine. No evidence of rib fracture. Review of the MIP images confirms the above findings. IMPRESSION: No acute cardiopulmonary disease. Mild cardiomegaly. Electronically Signed   By: Elberta Fortis M.D.   On: 11/13/2015 17:53   I have personally reviewed and evaluated these images and lab results as part of my medical decision-making.   EKG Interpretation   Date/Time:  Friday November 13 2015 11:26:45 EST Ventricular Rate:  75 PR Interval:  212 QRS Duration: 130 QT Interval:  386 QTC Calculation: 431 R Axis:   -25 Text Interpretation:  Sinus rhythm with 1st degree A-V block Right bundle  branch block Abnormal ECG Confirmed by Juleen China  MD, Khoury Siemon ([id number]) on  11/13/2015 3:54:01 PM      MDM   Final diagnoses:  Dyspnea    82 year old female  with dyspnea. Etiology not completely clear. Workup fairly unremarkable. She is hemodynamically stable. No increased work of breathing. Doubt anginal equivalent. CT without evidence of pulmonary embolism.    Raeford Razor, MD 11/19/15 [phone]

## 2016-08-26 ENCOUNTER — Emergency Department (HOSPITAL_COMMUNITY): Payer: Medicare Other

## 2016-08-26 ENCOUNTER — Emergency Department (HOSPITAL_COMMUNITY)
Admission: EM | Admit: 2016-08-26 | Discharge: 2016-08-27 | Disposition: A | Discharge status: Home / Self Care | Payer: Medicare Other | Attending: Emergency Medicine | Admitting: Emergency Medicine

## 2016-08-26 ENCOUNTER — Encounter (HOSPITAL_COMMUNITY): Payer: Self-pay

## 2016-08-26 DIAGNOSIS — I110 Hypertensive heart disease with heart failure: Secondary | ICD-10-CM | POA: Insufficient documentation

## 2016-08-26 DIAGNOSIS — M25461 Effusion, right knee: Secondary | ICD-10-CM | POA: Diagnosis not present

## 2016-08-26 DIAGNOSIS — Z79899 Other long term (current) drug therapy: Secondary | ICD-10-CM | POA: Insufficient documentation

## 2016-08-26 DIAGNOSIS — E119 Type 2 diabetes mellitus without complications: Secondary | ICD-10-CM | POA: Insufficient documentation

## 2016-08-26 DIAGNOSIS — Z7982 Long term (current) use of aspirin: Secondary | ICD-10-CM | POA: Insufficient documentation

## 2016-08-26 DIAGNOSIS — I509 Heart failure, unspecified: Secondary | ICD-10-CM | POA: Insufficient documentation

## 2016-08-26 DIAGNOSIS — M25561 Pain in right knee: Secondary | ICD-10-CM | POA: Diagnosis present

## 2016-08-26 NOTE — ED Notes (Signed)
Ice pack applied to  Rt. Knee.  Warm blanket given

## 2016-08-26 NOTE — ED Triage Notes (Signed)
Pt. Reports having rt. Knee pain , began a few days ago.  Pt. Denies any injuries. Has a hx of arthritis.   Rt. Knee is swollen, no redness noted or warmth.   Pt. Is unable to stand on her knee. Pt. Denies any sob , n/v/d.  Skin is warm and dry.  No acute distress .

## 2016-08-27 MED ORDER — PREDNISONE 10 MG PO TABS: 20.0000 mg | ORAL_TABLET | Freq: Every day | ORAL | 0 refills | Status: DC

## 2016-08-27 MED ORDER — HYDROCODONE-ACETAMINOPHEN 5-325 MG PO TABS: 1.0000 | ORAL_TABLET | Freq: Four times a day (QID) | ORAL | 0 refills | Status: DC | PRN

## 2016-08-27 MED ORDER — HYDROCODONE-ACETAMINOPHEN 5-325 MG PO TABS
1.0000 | ORAL_TABLET | Freq: Once | ORAL | Status: AC
  Administered 2016-08-27: 1 via ORAL

## 2016-08-27 MED ORDER — HYDROCODONE-ACETAMINOPHEN 5-325 MG PO TABS
ORAL_TABLET | ORAL | Status: AC
  Filled 2016-08-27: qty 1

## 2016-08-27 NOTE — ED Notes (Signed)
Pt placed on bedpan

## 2016-08-27 NOTE — ED Provider Notes (Signed)
[facility]-EMERGENCY DEPT Provider Note   CSN: [id number] Arrival date & time: 08/26/16  1644     History   Chief Complaint Chief Complaint  Patient presents with  . Leg Pain    HPI Catherine Vazquez is a 82 y.o. female.  HPI   Pt with PMH of arthritis, CHF, diabetes, hypertension comes to the ER with complaints of right knee pain and swelling which is causing her to be unable to bear weight. Her symptoms started a couple of days ago. They deny that she has fallen or had an injury to it. She does have hx of arthritis. She has not had any SOB, CP, n/v/d. She has not had any weakness. Knee is not red or warm. She denies numbness.  Past Medical History:  Diagnosis Date  . Arthritis   . CHF (congestive heart failure) (HCC)   . Diabetes mellitus without complication (HCC)   . Hypertension     Patient Active Problem List   Diagnosis Date Noted  . CHF with left ventricular diastolic dysfunction, NYHA class 2 (HCC) 09/13/2013  . DOE (dyspnea on exertion) 09/12/2013  . Fatigue 09/12/2013  . HBP (high blood pressure) 09/12/2013  . Arthritis     Past Surgical History:  Procedure Laterality Date  . ABDOMINAL HYSTERECTOMY    . BUNIONECTOMY    . CARPAL TUNNEL RELEASE    . EYE SURGERY    . KNEE ARTHROSCOPY    . ROTATOR CUFF REPAIR    . TONSILLECTOMY      OB History    No data available       Home Medications    Prior to Admission medications   Medication Sig Start Date End Date Taking? Authorizing Provider  amLODipine (NORVASC) 5 MG tablet Take 5 mg by mouth daily.   Yes Historical Provider, MD  aspirin EC 81 MG tablet Take 81 mg by mouth daily.   Yes Historical Provider, MD  carvedilol (COREG) 6.25 MG tablet Take 6.25 mg by mouth 2 (two) times daily with a meal.   Yes Historical Provider, MD  losartan (COZAAR) 100 MG tablet Take 100 mg by mouth daily.   Yes Historical Provider, MD  Misc Natural Products (OSTEO BI-FLEX/5-LOXIN ADVANCED PO) Take 1 tablet by mouth 2 (two)  times daily.    Yes Historical Provider, MD  mometasone (NASONEX) 50 MCG/ACT nasal spray Place 2 sprays into the nose daily.   Yes Historical Provider, MD  Multiple Vitamins-Minerals (MULTIVITAMIN ADULTS) TABS See admin instructions. Pack of vitamins:Take 1 pack by mouth once a day   Yes Historical Provider, MD  sitaGLIPtin (JANUVIA) 25 MG tablet Take 25 mg by mouth daily.   Yes Historical Provider, MD  amoxicillin-clavulanate (AUGMENTIN) 875-125 MG per tablet Take 1 tablet by mouth every 12 (twelve) hours. Patient not taking: Reported on 08/26/2016 02/08/15   Eber Hong, MD  furosemide (LASIX) 20 MG tablet Take 1 tablet (20 mg total) by mouth daily. Patient not taking: Reported on 08/26/2016 09/13/13   Nishant Dhungel, MD  HYDROcodone-acetaminophen (NORCO/VICODIN) 5-325 MG tablet Take 1 tablet by mouth every 6 (six) hours as needed. 08/27/16   Dominiqua Cooner Neva Seat, PA-C  ibuprofen (ADVIL,MOTRIN) 800 MG tablet Take 1 tablet (800 mg total) by mouth 3 (three) times daily. Patient not taking: Reported on 08/26/2016 02/08/15   Eber Hong, MD  predniSONE (DELTASONE) 10 MG tablet Take 2 tablets (20 mg total) by mouth daily. 08/27/16   Marlon Pel, PA-C    Family History No family history on  file.  Social History Social History  Substance Use Topics  . Smoking status: Never Smoker  . Smokeless tobacco: Never Used  . Alcohol use No     Allergies   Patient has no known allergies.   Review of Systems Review of Systems Review of Systems All other systems negative except as documented in the HPI. All pertinent positives and negatives as reviewed in the HPI.   Physical Exam Updated Vital Signs BP 146/55   Pulse 69   Temp 99.2 F (37.3 C) (Oral)   Resp 17   Ht 5\' 3"  (1.6 m)   Wt 78.9 kg   SpO2 100%   BMI 30.82 kg/m   Physical Exam  Constitutional: She appears well-developed and well-nourished. No distress.  HENT:  Head: Normocephalic and atraumatic.  Eyes: Pupils are equal, round, and  reactive to light.  Neck: Normal range of motion. Neck supple.  Cardiovascular: Normal rate and regular rhythm.   Pulmonary/Chest: Effort normal.  Abdominal: Soft.  Musculoskeletal:       Right knee: She exhibits decreased range of motion (due to pain), swelling and effusion. Tenderness found.  No induration or erythema  Neurological: She is alert.  Skin: Skin is warm and dry.  Nursing note and vitals reviewed.    ED Treatments / Results  Labs (all labs ordered are listed, but only abnormal results are displayed) Labs Reviewed - No data to display  EKG  EKG Interpretation None       Radiology Dg Knee Complete 4 Views Right  Result Date: 08/26/2016 CLINICAL DATA:  Right knee pain and swelling for 2 days without known injury EXAM: RIGHT KNEE - COMPLETE 4+ VIEW COMPARISON:  None. FINDINGS: There is femorotibial and patellofemoral joint space narrowing with spurring and subchondral degenerative cystic lucencies involving the femoral condyles and tibial plateau consistent with osteoarthritis. There is a small suprapatellar joint effusion. No acute fractures noted. Tiny ossification noted posterior to the knee joint may represent a small loose body. Soft tissues unremarkable.   IMPRESSION: Tricompartmental osteoarthritis with small joint effusion and probable posterior intra-articular ossific loose body. No acute osseous abnormality. Electronically Signed   By: Tollie Ethavid  Kwon M.D.   On: 08/26/2016 22:13    Procedures Procedures (including critical care time)  Medications Ordered in ED Medications  HYDROcodone-acetaminophen (NORCO/VICODIN) 5-325 MG per tablet 1 tablet (1 tablet Oral Given 08/27/16 0117)     Initial Impression / Assessment and Plan / ED Course  I have reviewed the triage vital signs and the nursing notes.  Pertinent labs & imaging results that were available during my care of the patient were reviewed by me and considered in my medical decision making (see chart for  details).  Clinical Course     CT of knoww shows large effusion and loose body within the knee joint as well as tricompartmental arthritis. Patient given pain medication. I stood her up to ambulate her and she was able to take a few steps with significant pain. Daughter says that the patient lives at a full care facility and they have wheel chairs but she does need to be able to walk a few steps to the bathroom. Patient offered admission but she really wants to go home. I advised we give pain medication and wait 15 more minutes and rechecked, pt called out and says she wants to go home. Will give small rx of Vicodin for pain and steroid dose pack. Pt advised to watch glucose closely as prednisone may cause it  to rise.  I discussed results, diagnoses and plan with Jackelyn Poling Sparlin. They voice there understanding and questions were answered. We discussed follow-up recommendations and return precautions.   Final Clinical Impressions(s) / ED Diagnoses   Final diagnoses:  Effusion, right knee    New Prescriptions New Prescriptions   HYDROCODONE-ACETAMINOPHEN (NORCO/VICODIN) 5-325 MG TABLET    Take 1 tablet by mouth every 6 (six) hours as needed.   PREDNISONE (DELTASONE) 10 MG TABLET    Take 2 tablets (20 mg total) by mouth daily.     Marlon Pel, PA-C 08/27/16 6384    Marily Memos, MD 08/28/16 1012

## 2016-08-27 NOTE — ED Notes (Signed)
Pt verbalized understanding discharge instructions and denies any further needs or questions at this time. VS stable, ambulatory and steady gait.   

## 2016-11-21 ENCOUNTER — Telehealth (HOSPITAL_COMMUNITY): Payer: Self-pay | Admitting: *Deleted

## 2016-11-28 ENCOUNTER — Telehealth (HOSPITAL_COMMUNITY): Payer: Self-pay

## 2016-11-28 NOTE — Telephone Encounter (Signed)
Called patient in regards to Pulmonary Rehab referral. LMTCB. This is the second message left.

## 2016-12-12 ENCOUNTER — Telehealth (HOSPITAL_COMMUNITY): Payer: Self-pay | Admitting: *Deleted

## 2016-12-30 ENCOUNTER — Encounter (HOSPITAL_COMMUNITY): Payer: Self-pay

## 2016-12-30 ENCOUNTER — Encounter (HOSPITAL_COMMUNITY)
Admission: RE | Admit: 2016-12-30 | Discharge: 2016-12-30 | Disposition: A | Discharge status: Home / Self Care | Payer: Medicare Other | Source: Ambulatory Visit | Attending: Cardiology | Admitting: Cardiology

## 2016-12-30 VITALS — BP 149/58 | HR 54 | Resp 18 | Ht 63.5 in | Wt 166.2 lb

## 2016-12-30 DIAGNOSIS — I272 Pulmonary hypertension, unspecified: Secondary | ICD-10-CM | POA: Insufficient documentation

## 2016-12-30 DIAGNOSIS — E119 Type 2 diabetes mellitus without complications: Secondary | ICD-10-CM | POA: Insufficient documentation

## 2016-12-30 DIAGNOSIS — I509 Heart failure, unspecified: Secondary | ICD-10-CM | POA: Insufficient documentation

## 2016-12-30 DIAGNOSIS — I110 Hypertensive heart disease with heart failure: Secondary | ICD-10-CM | POA: Insufficient documentation

## 2016-12-30 DIAGNOSIS — Z7982 Long term (current) use of aspirin: Secondary | ICD-10-CM | POA: Diagnosis not present

## 2016-12-30 DIAGNOSIS — Z79899 Other long term (current) drug therapy: Secondary | ICD-10-CM | POA: Insufficient documentation

## 2016-12-30 DIAGNOSIS — Z7984 Long term (current) use of oral hypoglycemic drugs: Secondary | ICD-10-CM | POA: Diagnosis not present

## 2016-12-30 DIAGNOSIS — M1990 Unspecified osteoarthritis, unspecified site: Secondary | ICD-10-CM | POA: Diagnosis not present

## 2016-12-30 NOTE — Progress Notes (Signed)
Catherine Vazquez 83 y.o. female Pulmonary Rehab Orientation Note Patient arrived today in [facility] for orientation to Pulmonary Rehab. She was transported from [facility] via wheel chair, accompanied by her daughter-in-law. She does carry portable oxygen. Per pt, she uses oxygen continuously at 2 liters per minute. Color good, skin warm and dry. Patient is oriented to time and place. Patient's medical history, psychosocial health, and medications reviewed. Psychosocial assessment reveals pt lives alone in an apartment in an senior living community. Pt is currently retired [employer] information [employer] manager. Pt hobbies include reading. Pt reports her stress level is moderate. Areas of stress/anxiety include Family. She admits to still morning the loss of her husband 4 years ago and her son in 2006.  Pt does exhibit signs of depression. Signs of depression include sadness and difficulty maintaining sleep. PHQ2/9 score 2/8. Pt shows good  coping skills with positive outlook. She is offered emotional support and reassurance. Will continue to monitor and evaluate progress toward psychosocial goal(s) of managing depression symptoms through exercise and recognizing when or notify Md for help. Physical assessment reveals heart rate is bradycardic, breath sounds clear to auscultation, no wheezes, rales, or rhonchi. Grip strength equal, strong. Distal pulses palpable. Mild pitting edema noted to lower legs.Patient reports she take medications as prescribed. Patient states she follows a Diabetic diet. The patient reports no specific efforts to gain or lose weight.. Patient's weight will be monitored closely. Demonstration and practice of PLB using pulse oximeter. Patient able to return demonstration satisfactorily. Safety and hand hygiene in the exercise area reviewed with patient. Patient voices understanding of the information reviewed. Department expectations discussed with patient and achievable goals were  set. The patient shows enthusiasm about attending the program and we look forward to working with this nice lady. The patient is scheduled for a 6 min walk test on 01/03/17 and to begin exercise on 01/10/17 at 1:30.   45 minutes was spent on a variety of activities such as assessment of the patient, obtaining baseline data including height, weight, BMI, and grip strength, verifying medical history, allergies, and current medications, and teaching patient strategies for performing tasks with less respiratory effort with emphasis on pursed lip breathing.

## 2017-01-03 ENCOUNTER — Encounter (HOSPITAL_COMMUNITY)
Admission: RE | Admit: 2017-01-03 | Discharge: 2017-01-03 | Disposition: A | Discharge status: Home / Self Care | Payer: Medicare Other | Source: Ambulatory Visit | Attending: Cardiology | Admitting: Cardiology

## 2017-01-03 DIAGNOSIS — I272 Pulmonary hypertension, unspecified: Secondary | ICD-10-CM

## 2017-01-03 NOTE — Progress Notes (Signed)
Pulmonary Individual Treatment Plan  Patient Details  Name: Catherine Vazquez MRN: [id number] Date of Birth: Dec 09, 1924 Referring Provider:     Pulmonary Rehab Walk Test from 01/03/2017 in [facility]  Referring Provider  Dr. Jacinto Halim      Initial Encounter Date:    Pulmonary Rehab Walk Test from 01/03/2017 in [facility]  Date  01/03/17  Referring Provider  Dr. Jacinto Halim      Visit Diagnosis: Pulmonary hypertension  Patient's Home Medications on Admission:   Current Outpatient Prescriptions:  .  amLODipine (NORVASC) 5 MG tablet, Take 5 mg by mouth daily., Disp: , Rfl:  .  aspirin EC 81 MG tablet, Take 81 mg by mouth daily., Disp: , Rfl:  .  carvedilol (COREG) 6.25 MG tablet, Take 6.25 mg by mouth 2 (two) times daily with a meal., Disp: , Rfl:  .  losartan (COZAAR) 100 MG tablet, Take 100 mg by mouth daily., Disp: , Rfl:  .  Misc Natural Products (OSTEO BI-FLEX/5-LOXIN ADVANCED PO), Take 1 tablet by mouth 2 (two) times daily. , Disp: , Rfl:  .  mometasone (NASONEX) 50 MCG/ACT nasal spray, Place 2 sprays into the nose daily., Disp: , Rfl:  .  Multiple Vitamins-Minerals (MULTIVITAMIN ADULTS) TABS, See admin instructions. Pack of vitamins:Take 1 pack by mouth once a day, Disp: , Rfl:  .  sitaGLIPtin (JANUVIA) 25 MG tablet, Take 25 mg by mouth daily., Disp: , Rfl:   Past Medical History: Past Medical History:  Diagnosis Date  . Arthritis   . CHF (congestive heart failure) (HCC)   . Diabetes mellitus without complication (HCC)   . Hypertension     Tobacco Use: History  Smoking Status  . Never Smoker  Smokeless Tobacco  . Never Used    Labs: Recent Review Flowsheet Data    There is no flowsheet data to display.      Capillary Blood Glucose: Lab Results  Component Value Date   GLUCAP 104 (H) 09/13/2013   GLUCAP 104 (H) 09/12/2013   GLUCAP 119 (H) 09/12/2013   GLUCAP 103 (H) 09/12/2013   GLUCAP 88 09/12/2013      ADL UCSD:   Pulmonary Function Assessment:     Pulmonary Function Assessment - 12/30/16 1219      Breath   Bilateral Breath Sounds Clear   Shortness of Breath Yes;Limiting activity      Exercise Target Goals: Date: 01/03/17  Exercise Program Goal: Individual exercise prescription set with THRR, safety & activity barriers. Participant demonstrates ability to understand and report RPE using BORG scale, to self-measure pulse accurately, and to acknowledge the importance of the exercise prescription.  Exercise Prescription Goal: Starting with aerobic activity 30 plus minutes a day, 3 days per week for initial exercise prescription. Provide home exercise prescription and guidelines that participant acknowledges understanding prior to discharge.  Activity Barriers & Risk Stratification:   6 Minute Walk:     6 Minute Walk    Row Name 01/03/17 1636         6 Minute Walk   Phase Initial     Distance 850 feet     Walk Time 6 minutes     # of Rest Breaks 0     MPH 1.6     METS 2.23     RPE 12     Perceived Dyspnea  1     Symptoms Yes (comment)     Comments all over fatigue  Resting HR 70 bpm     Resting BP 150/70     Max Ex. HR 110 bpm     Max Ex. BP 185/72     2 Minute Post BP 160/83       Interval HR   Baseline HR 70     1 Minute HR 82     2 Minute HR 82     3 Minute HR 110     4 Minute HR 110     5 Minute HR 86     6 Minute HR 86     Interval Heart Rate? Yes       Interval Oxygen   Interval Oxygen? Yes     Baseline Oxygen Saturation % 100 %     Baseline Liters of Oxygen 2 L     1 Minute Oxygen Saturation % 99 %     1 Minute Liters of Oxygen 2 L     2 Minute Oxygen Saturation % 100 %     2 Minute Liters of Oxygen 2 L     3 Minute Oxygen Saturation % 93 %     3 Minute Liters of Oxygen 2 L     4 Minute Oxygen Saturation % 92 %     4 Minute Liters of Oxygen 2 L     5 Minute Oxygen Saturation % 90 %     5 Minute Liters of Oxygen 2 L     6  Minute Oxygen Saturation % 90 %     6 Minute Liters of Oxygen 2 L        Oxygen Initial Assessment:     Oxygen Initial Assessment - 01/03/17 1635      Home Oxygen   Home Oxygen Device E-Tanks;Home Concentrator   Sleep Oxygen Prescription Continuous   Liters per minute 2   Home Exercise Oxygen Prescription Continuous   Liters per minute 2   Home at Rest Exercise Oxygen Prescription Continuous   Liters per minute 2   Compliance with Home Oxygen Use Yes     Initial 6 min Walk   Oxygen Used Continuous;E-Tanks   Liters per minute 2   Resting Oxygen Saturation  during 6 min walk 100 %   Exercise Oxygen Saturation  during 6 min walk 90 %     Program Oxygen Prescription   Program Oxygen Prescription Continuous;E-Tanks   Liters per minute 2     Intervention   Short Term Goals To learn and exhibit compliance with exercise, home and travel O2 prescription      Oxygen Re-Evaluation:   Oxygen Discharge (Final Oxygen Re-Evaluation):   Initial Exercise Prescription:     Initial Exercise Prescription - 01/03/17 1600      Date of Initial Exercise RX and Referring Provider   Date 01/03/17   Referring Provider Dr. Jacinto Halim     Oxygen   Oxygen Continuous   Liters 2     NuStep   Level 2   Minutes 17   METs 1.5     Arm Ergometer   Level 2   Minutes 17     Track   Laps 5   Minutes 17     Prescription Details   Frequency (times per week) 2   Duration Progress to 45 minutes of aerobic exercise without signs/symptoms of physical distress     Intensity   THRR 40-80% of Max Heartrate 52-105   Ratings of Perceived Exertion 11-13   Perceived Dyspnea 0-4  Progression   Progression Continue progressive overload as per policy without signs/symptoms or physical distress.     Resistance Training   Training Prescription Yes   Weight orange bands   Reps 10-15      Perform Capillary Blood Glucose checks as needed.  Exercise Prescription Changes:   Exercise  Comments:   Exercise Goals and Review:     Exercise Goals    Row Name 12/30/16 1040             Exercise Goals   Increase Physical Activity Yes       Intervention Provide advice, education, support and counseling about physical activity/exercise needs.;Develop an individualized exercise prescription for aerobic and resistive training based on initial evaluation findings, risk stratification, comorbidities and participant's personal goals.       Expected Outcomes Achievement of increased cardiorespiratory fitness and enhanced flexibility, muscular endurance and strength shown through measurements of functional capacity and personal statement of participant.       Increase Strength and Stamina Yes       Intervention Provide advice, education, support and counseling about physical activity/exercise needs.;Develop an individualized exercise prescription for aerobic and resistive training based on initial evaluation findings, risk stratification, comorbidities and participant's personal goals.       Expected Outcomes Achievement of increased cardiorespiratory fitness and enhanced flexibility, muscular endurance and strength shown through measurements of functional capacity and personal statement of participant.          Exercise Goals Re-Evaluation :   Discharge Exercise Prescription (Final Exercise Prescription Changes):   Nutrition:  Target Goals: Understanding of nutrition guidelines, daily intake of sodium 1500mg , cholesterol 200mg , calories 30% from fat and 7% or less from saturated fats, daily to have 5 or more servings of fruits and vegetables.  Biometrics:     Pre Biometrics - 12/30/16 1040      Pre Biometrics   Grip Strength 15 kg       Nutrition Therapy Plan and Nutrition Goals:   Nutrition Discharge: Rate Your Plate Scores:   Nutrition Goals Re-Evaluation:   Nutrition Goals Discharge (Final Nutrition Goals Re-Evaluation):   Psychosocial: Target Goals:  Acknowledge presence or absence of significant depression and/or stress, maximize coping skills, provide positive support system. Participant is able to verbalize types and ability to use techniques and skills needed for reducing stress and depression.  Initial Review & Psychosocial Screening:     Initial Psych Review & Screening - 12/30/16 1220      Initial Review   Current issues with Current Depression  per [facility] 2/9     Family Dynamics   Good Support System? Yes   Concerns Recent loss of significant other   Comments loss of child in 2006     Barriers   Psychosocial barriers to participate in program There are no identifiable barriers or psychosocial needs.     Screening Interventions   Interventions Encouraged to exercise      Quality of Life Scores:   PHQ-9: Recent Review Flowsheet Data    Depression screen [facility] 2/9 12/30/2016   Decreased Interest 1   Down, Depressed, Hopeless 1   PHQ - 2 Score 2   Altered sleeping 1   Tired, decreased energy 3   Change in appetite 2   Feeling bad or failure about yourself  0   Trouble concentrating 0   Moving slowly or fidgety/restless 0   Suicidal thoughts 0   PHQ-9 Score 8   Difficult doing work/chores Not difficult at  all     Interpretation of Total Score  Total Score Depression Severity:  1-4 = Minimal depression, 5-9 = Mild depression, 10-14 = Moderate depression, 15-19 = Moderately severe depression, 20-27 = Severe depression   Psychosocial Evaluation and Intervention:   Psychosocial Re-Evaluation:   Psychosocial Discharge (Final Psychosocial Re-Evaluation):   Education: Education Goals: Education classes will be provided on a weekly basis, covering required topics. Participant will state understanding/return demonstration of topics presented.  Learning Barriers/Preferences:     Learning Barriers/Preferences - 12/30/16 1126      Learning Barriers/Preferences   Learning Barriers None   Learning Preferences  Computer/Internet;Group Instruction;Verbal Instruction;Written Material      Education Topics: Risk Factor Reduction:  -Group instruction that is supported by a PowerPoint presentation. Instructor discusses the definition of a risk factor, different risk factors for pulmonary disease, and how the heart and lungs work together.     Nutrition for Pulmonary Patient:  -Group instruction provided by PowerPoint slides, verbal discussion, and written materials to support subject matter. The instructor gives an explanation and review of healthy diet recommendations, which includes a discussion on weight management, recommendations for fruit and vegetable consumption, as well as protein, fluid, caffeine, fiber, sodium, sugar, and alcohol. Tips for eating when patients are short of breath are discussed.   Pursed Lip Breathing:  -Group instruction that is supported by demonstration and informational handouts. Instructor discusses the benefits of pursed lip and diaphragmatic breathing and detailed demonstration on how to preform both.     Oxygen Safety:  -Group instruction provided by PowerPoint, verbal discussion, and written material to support subject matter. There is an overview of "What is Oxygen" and "Why do we need it".  Instructor also reviews how to create a safe environment for oxygen use, the importance of using oxygen as prescribed, and the risks of noncompliance. There is a brief discussion on traveling with oxygen and resources the patient may utilize.   Oxygen Equipment:  -Group instruction provided by [facility] Staff utilizing handouts, written materials, and equipment demonstrations.   Signs and Symptoms:  -Group instruction provided by written material and verbal discussion to support subject matter. Warning signs and symptoms of infection, stroke, and heart attack are reviewed and when to call the physician/911 reinforced. Tips for preventing the spread of infection  discussed.   Advanced Directives:  -Group instruction provided by verbal instruction and written material to support subject matter. Instructor reviews Advanced Directive laws and proper instruction for filling out document.   Pulmonary Video:  -Group video education that reviews the importance of medication and oxygen compliance, exercise, good nutrition, pulmonary hygiene, and pursed lip and diaphragmatic breathing for the pulmonary patient.   Exercise for the Pulmonary Patient:  -Group instruction that is supported by a PowerPoint presentation. Instructor discusses benefits of exercise, core components of exercise, frequency, duration, and intensity of an exercise routine, importance of utilizing pulse oximetry during exercise, safety while exercising, and options of places to exercise outside of rehab.     Pulmonary Medications:  -Verbally interactive group education provided by instructor with focus on inhaled medications and proper administration.   Anatomy and Physiology of the Respiratory System and Intimacy:  -Group instruction provided by PowerPoint, verbal discussion, and written material to support subject matter. Instructor reviews respiratory cycle and anatomical components of the respiratory system and their functions. Instructor also reviews differences in obstructive and restrictive respiratory diseases with examples of each. Intimacy, Sex, and Sexuality differences are reviewed with a discussion on  how relationships can change when diagnosed with pulmonary disease. Common sexual concerns are reviewed.   Knowledge Questionnaire Score:   Core Components/Risk Factors/Patient Goals at Admission:     Personal Goals and Risk Factors at Admission - 12/30/16 1219      Core Components/Risk Factors/Patient Goals on Admission   Improve shortness of breath with ADL's Yes   Intervention Provide education, individualized exercise plan and daily activity instruction to help  decrease symptoms of SOB with activities of daily living.   Expected Outcomes Short Term: Achieves a reduction of symptoms when performing activities of daily living.   Develop more efficient breathing techniques such as purse lipped breathing and diaphragmatic breathing; and practicing self-pacing with activity Yes   Intervention Provide education, demonstration and support about specific breathing techniuqes utilized for more efficient breathing. Include techniques such as pursed lipped breathing, diaphragmatic breathing and self-pacing activity.   Expected Outcomes Short Term: Participant will be able to demonstrate and use breathing techniques as needed throughout daily activities.   Hypertension Yes   Intervention Provide education on lifestyle modifcations including regular physical activity/exercise, weight management, moderate sodium restriction and increased consumption of fresh fruit, vegetables, and low fat dairy, alcohol moderation, and smoking cessation.;Monitor prescription use compliance.   Expected Outcomes Short Term: Continued assessment and intervention until BP is < 140/41mm HG in hypertensive participants. < 130/37mm HG in hypertensive participants with diabetes, heart failure or chronic kidney disease.;Long Term: Maintenance of blood pressure at goal levels.      Core Components/Risk Factors/Patient Goals Review:    Core Components/Risk Factors/Patient Goals at Discharge (Final Review):    ITP Comments:   Comments:

## 2017-01-10 ENCOUNTER — Encounter (HOSPITAL_COMMUNITY)
Admission: RE | Admit: 2017-01-10 | Discharge: 2017-01-10 | Disposition: A | Discharge status: Home / Self Care | Payer: Medicare Other | Source: Ambulatory Visit | Attending: Cardiology | Admitting: Cardiology

## 2017-01-10 VITALS — Wt 162.9 lb

## 2017-01-10 DIAGNOSIS — I272 Pulmonary hypertension, unspecified: Secondary | ICD-10-CM | POA: Diagnosis not present

## 2017-01-10 NOTE — Progress Notes (Signed)
Daily Session Note  Patient Details  Name: Catherine Vazquez MRN: [id number] Date of Birth: Mar 17, 1925 Referring Provider:     Pulmonary Rehab Walk Test from 01/03/2017 in [place]  Referring Provider  Dr. Einar Gip      Encounter Date: 01/10/2017  Check In:     Session Check In - 01/10/17 1504      Check-In   Location [facility]      Capillary Blood Glucose: No results found for this or any previous visit (from the past 24 hour(s)).      Exercise Prescription Changes - 01/10/17 1500      Response to Exercise   Blood Pressure (Admit) 124/60   Blood Pressure (Exercise) 136/70   Blood Pressure (Exit) 108/54   Heart Rate (Admit) 55 bpm   Heart Rate (Exercise) 112 bpm   Heart Rate (Exit) 60 bpm   Oxygen Saturation (Admit) 97 %   Oxygen Saturation (Exercise) 90 %   Oxygen Saturation (Exit) 100 %   Rating of Perceived Exertion (Exercise) 11   Perceived Dyspnea (Exercise) 1   Duration Progress to 45 minutes of aerobic exercise without signs/symptoms of physical distress   Intensity THRR unchanged     Resistance Training   Training Prescription Yes   Weight orange bands   Reps 10-15   Time 10 Minutes     Oxygen   Oxygen Continuous   Liters 2     NuStep   Level 2   Minutes 17   METs 1.8     Arm Ergometer   Level 2   Minutes 17     Track   Laps 7   Minutes 17      History  Smoking Status  . Never Smoker  Smokeless Tobacco  . Never Used    Goals Met:  Exercise tolerated well No report of cardiac concerns or symptoms Strength training completed today  Goals Unmet:  Not Applicable  Comments: Service time is from 1:30p to 3:00p    Dr. Rush Farmer is Medical Director for Pulmonary Rehab at [facility].

## 2017-01-12 ENCOUNTER — Encounter (HOSPITAL_COMMUNITY)
Admission: RE | Admit: 2017-01-12 | Discharge: 2017-01-12 | Disposition: A | Discharge status: Home / Self Care | Payer: Medicare Other | Source: Ambulatory Visit | Attending: Cardiology | Admitting: Cardiology

## 2017-01-12 VITALS — Wt 163.4 lb

## 2017-01-12 DIAGNOSIS — I272 Pulmonary hypertension, unspecified: Secondary | ICD-10-CM | POA: Diagnosis not present

## 2017-01-12 NOTE — Progress Notes (Signed)
Daily Session Note  Patient Details  Name: Catherine Vazquez MRN: [id number] Date of Birth: Aug 03, 1925 Referring Provider:     Pulmonary Rehab Walk Test from 01/03/2017 in [place]  Referring Provider  Dr. Einar Gip      Encounter Date: 01/12/2017  Check In:     Session Check In - 01/12/17 1330      Check-In   Location [facility]   Staff Present Rosebud Poles, RN, BSN;Molly diVincenzo, MS, ACSM RCEP, Exercise Physiologist;Portia Rollene Rotunda, RN, BSN   Supervising physician immediately available to respond to emergencies [facility] immediately available   Physician(s) Dr. Sloan Leiter   Medication changes reported     No   Fall or balance concerns reported    No   Tobacco Cessation No Change   Warm-up and Cool-down Performed as group-led instruction   Resistance Training Performed Yes   VAD Patient? No     Pain Assessment   Currently in Pain? No/denies   Multiple Pain Sites No      Capillary Blood Glucose: No results found for this or any previous visit (from the past 24 hour(s)).      Exercise Prescription Changes - 01/12/17 1600      Response to Exercise   Blood Pressure (Admit) 138/60   Blood Pressure (Exercise) 120/64   Blood Pressure (Exit) 116/60   Heart Rate (Admit) 69 bpm   Heart Rate (Exercise) 65 bpm   Heart Rate (Exit) 60 bpm   Oxygen Saturation (Admit) 100 %   Oxygen Saturation (Exercise) 100 %   Oxygen Saturation (Exit) 100 %   Rating of Perceived Exertion (Exercise) 13   Perceived Dyspnea (Exercise) 2   Duration Progress to 45 minutes of aerobic exercise without signs/symptoms of physical distress   Intensity THRR unchanged     Resistance Training   Training Prescription Yes   Weight orange bands   Reps 10-15   Time 10 Minutes     Interval Training   Interval Training No     Oxygen   Oxygen Continuous   Liters 2     NuStep   Level 2   Minutes 17   METs 1.7     Track   Laps 6   Minutes  17      History  Smoking Status  . Never Smoker  Smokeless Tobacco  . Never Used    Goals Met:  Exercise tolerated well Strength training completed today  Goals Unmet:  Not Applicable  Comments: Service time is from 1330 to 116/60     Dr. Rush Farmer is Medical Director for Pulmonary Rehab at [facility].

## 2017-01-17 ENCOUNTER — Encounter (HOSPITAL_COMMUNITY)
Admission: RE | Admit: 2017-01-17 | Discharge: 2017-01-17 | Disposition: A | Discharge status: Home / Self Care | Payer: Medicare Other | Source: Ambulatory Visit | Attending: Cardiology | Admitting: Cardiology

## 2017-01-17 VITALS — Wt 162.7 lb

## 2017-01-17 DIAGNOSIS — I272 Pulmonary hypertension, unspecified: Secondary | ICD-10-CM

## 2017-01-17 NOTE — Progress Notes (Signed)
Daily Session Note  Patient Details  Name: Catherine Vazquez MRN: [id number] Date of Birth: April 03, 1925 Referring Provider:     Pulmonary Rehab Walk Test from 01/03/2017 in [place]  Referring Provider  Dr. Einar Gip      Encounter Date: 01/17/2017  Check In:     Session Check In - 01/17/17 1353      Check-In   Location [facility]   Staff Present Rosebud Poles, RN, BSN;Molly diVincenzo, MS, ACSM RCEP, Exercise Physiologist;Kamaal Cast Rollene Rotunda, RN, Roque Cash, RN   Supervising physician immediately available to respond to emergencies [facility] immediately available   Physician(s) Dr. Candiss Norse   Medication changes reported     No   Fall or balance concerns reported    No   Comments intermittant dizziness with standing. MD aware   Tobacco Cessation No Change   Warm-up and Cool-down Performed as group-led instruction   Resistance Training Performed Yes   VAD Patient? No     Pain Assessment   Currently in Pain? No/denies   Multiple Pain Sites No      Capillary Blood Glucose: No results found for this or any previous visit (from the past 24 hour(s)).      Exercise Prescription Changes - 01/17/17 1553      Response to Exercise   Blood Pressure (Admit) 140/50   Blood Pressure (Exercise) 142/60   Blood Pressure (Exit) 112/58   Heart Rate (Admit) 62 bpm   Heart Rate (Exercise) 69 bpm   Heart Rate (Exit) 66 bpm   Oxygen Saturation (Admit) 100 %   Oxygen Saturation (Exercise) 97 %   Oxygen Saturation (Exit) 100 %   Rating of Perceived Exertion (Exercise) 13   Perceived Dyspnea (Exercise) 1   Duration Progress to 45 minutes of aerobic exercise without signs/symptoms of physical distress   Intensity THRR unchanged     Resistance Training   Training Prescription Yes   Weight orange bands   Reps 10-15   Time 10 Minutes     Interval Training   Interval Training No     Oxygen   Oxygen Continuous   Liters 2     NuStep    Level 2   Minutes 17   METs 1.6     Arm Ergometer   Level 2   Minutes 17     Track   Laps 8   Minutes 17      History  Smoking Status  . Never Smoker  Smokeless Tobacco  . Never Used    Goals Met:  Exercise tolerated well Queuing for purse lip breathing No report of cardiac concerns or symptoms Strength training completed today  Goals Unmet:  Not Applicable  Comments: Service time is from 1330 to 1500   Dr. Rush Farmer is Medical Director for Pulmonary Rehab at [facility].

## 2017-01-19 ENCOUNTER — Encounter (HOSPITAL_COMMUNITY)
Admission: RE | Admit: 2017-01-19 | Discharge: 2017-01-19 | Disposition: A | Discharge status: Home / Self Care | Payer: Medicare Other | Source: Ambulatory Visit | Attending: Cardiology | Admitting: Cardiology

## 2017-01-19 VITALS — Wt 163.8 lb

## 2017-01-19 DIAGNOSIS — I272 Pulmonary hypertension, unspecified: Secondary | ICD-10-CM | POA: Diagnosis not present

## 2017-01-19 NOTE — Progress Notes (Signed)
Daily Session Note  Patient Details  Name: Catherine Vazquez MRN: [id number] Date of Birth: [birth year] Referring Provider:     Pulmonary Rehab Walk Test from 01/03/2017 in [place]  Referring Provider  Dr. Einar Gip      Encounter Date: 01/19/2017  Check In:     Session Check In - 01/19/17 1359      Check-In   Location [facility]   Staff Present Su Hilt, MS, ACSM RCEP, Exercise Physiologist;Lisa Ysidro Evert, RN;Portia Eagle, RN, Maxcine Ham, RN, BSN   Supervising physician immediately available to respond to emergencies [facility] immediately available   Physician(s) Dr. Posey Pronto   Medication changes reported     No   Fall or balance concerns reported    No   Tobacco Cessation No Change   Warm-up and Cool-down Performed as group-led instruction   Resistance Training Performed Yes   VAD Patient? No     Pain Assessment   Currently in Pain? No/denies   Multiple Pain Sites No      Capillary Blood Glucose: No results found for this or any previous visit (from the past 24 hour(s)).      Exercise Prescription Changes - 01/19/17 1500      Response to Exercise   Blood Pressure (Admit) 122/60   Blood Pressure (Exercise) 138/68   Blood Pressure (Exit) 132/60   Heart Rate (Admit) 67 bpm   Heart Rate (Exercise) 70 bpm   Heart Rate (Exit) 69 bpm   Oxygen Saturation (Admit) 98 %   Oxygen Saturation (Exercise) 100 %   Oxygen Saturation (Exit) 98 %   Rating of Perceived Exertion (Exercise) 15   Perceived Dyspnea (Exercise) 1   Duration Progress to 45 minutes of aerobic exercise without signs/symptoms of physical distress   Intensity THRR unchanged     Resistance Training   Training Prescription Yes   Weight orange bands   Reps 10-15   Time 10 Minutes     Interval Training   Interval Training No     Oxygen   Oxygen Continuous   Liters 2     NuStep   Level 2   Minutes 17   METs 1.7     Track   Laps 5    Minutes 17      History  Smoking Status  . Never Smoker  Smokeless Tobacco  . Never Used    Goals Met:  Exercise tolerated well Strength training completed today  Goals Unmet:  Not Applicable  Comments: Service time is from 1330 to [place]    Dr. Rush Farmer is Medical Director for Pulmonary Rehab at [facility].

## 2017-01-19 NOTE — Progress Notes (Signed)
Patient attended Doctor Day lecture today.

## 2017-01-24 ENCOUNTER — Encounter (HOSPITAL_COMMUNITY)
Admission: RE | Admit: 2017-01-24 | Discharge: 2017-01-24 | Disposition: A | Discharge status: Home / Self Care | Payer: Medicare Other | Source: Ambulatory Visit | Attending: Cardiology | Admitting: Cardiology

## 2017-01-24 VITALS — Wt 166.0 lb

## 2017-01-24 DIAGNOSIS — E119 Type 2 diabetes mellitus without complications: Secondary | ICD-10-CM | POA: Insufficient documentation

## 2017-01-24 DIAGNOSIS — Z79899 Other long term (current) drug therapy: Secondary | ICD-10-CM | POA: Diagnosis not present

## 2017-01-24 DIAGNOSIS — I509 Heart failure, unspecified: Secondary | ICD-10-CM | POA: Diagnosis not present

## 2017-01-24 DIAGNOSIS — Z7984 Long term (current) use of oral hypoglycemic drugs: Secondary | ICD-10-CM | POA: Insufficient documentation

## 2017-01-24 DIAGNOSIS — M1990 Unspecified osteoarthritis, unspecified site: Secondary | ICD-10-CM | POA: Insufficient documentation

## 2017-01-24 DIAGNOSIS — Z7982 Long term (current) use of aspirin: Secondary | ICD-10-CM | POA: Diagnosis not present

## 2017-01-24 DIAGNOSIS — I272 Pulmonary hypertension, unspecified: Secondary | ICD-10-CM | POA: Insufficient documentation

## 2017-01-24 DIAGNOSIS — I110 Hypertensive heart disease with heart failure: Secondary | ICD-10-CM | POA: Insufficient documentation

## 2017-01-24 LAB — GLUCOSE, CAPILLARY: Glucose-Capillary: 125 mg/dL — ABNORMAL HIGH (ref 65–99)

## 2017-01-24 NOTE — Progress Notes (Signed)
Daily Session Note  Patient Details  Name: Catherine Vazquez MRN: [id number] Date of Birth: 1925-08-28 Referring Provider:     Pulmonary Rehab Walk Test from 01/03/2017 in [facility]  Referring Provider  Dr. Einar Gip      Encounter Date: 01/24/2017  Check In:     Session Check In - 01/24/17 1330      Check-In   Location [facility]   Staff Present Rosebud Poles, RN, BSN;Molly diVincenzo, MS, ACSM RCEP, Exercise Physiologist;Lisa Ysidro Evert, RN;Portia Rollene Rotunda, RN, BSN   Supervising physician immediately available to respond to emergencies [facility] immediately available   Physician(s) Dr. Posey Pronto   Medication changes reported     No   Fall or balance concerns reported    No   Tobacco Cessation No Change   Warm-up and Cool-down Performed as group-led instruction   Resistance Training Performed Yes   VAD Patient? No     Pain Assessment   Currently in Pain? No/denies   Multiple Pain Sites No      Capillary Blood Glucose: Results for orders placed or performed during the hospital encounter of 01/24/17 (from the past 24 hour(s))  Glucose, capillary     Status: Abnormal   Collection Time: 01/24/17  2:45 PM  Result Value Ref Range   Glucose-Capillary 125 (H) 65 - 99 mg/dL        Exercise Prescription Changes - 01/24/17 1500      Response to Exercise   Blood Pressure (Admit) 128/50   Blood Pressure (Exercise) 152/64   Blood Pressure (Exit) 130/60   Heart Rate (Admit) 61 bpm   Heart Rate (Exercise) 73 bpm   Heart Rate (Exit) 58 bpm   Oxygen Saturation (Admit) 92 %   Oxygen Saturation (Exercise) 99 %   Oxygen Saturation (Exit) 100 %   Rating of Perceived Exertion (Exercise) 13   Perceived Dyspnea (Exercise) 1   Duration Progress to 45 minutes of aerobic exercise without signs/symptoms of physical distress   Intensity THRR unchanged     Resistance Training   Training Prescription Yes   Weight orange bands   Reps 10-15   Time 10 Minutes     Interval Training   Interval Training No     Oxygen   Oxygen Continuous   Liters 2     NuStep   Level 4   Minutes 17   METs 1.9     Arm Ergometer   Level 2   Minutes 17     Track   Laps 6   Minutes 17      History  Smoking Status  . Never Smoker  Smokeless Tobacco  . Never Used    Goals Met:  Exercise tolerated well Strength training completed today  Goals Unmet:  Not Applicable  Comments: Service time is from 1330 to 1500    Dr. Rush Farmer is Medical Director for Pulmonary Rehab at [facility].

## 2017-01-25 LAB — GLUCOSE, CAPILLARY: GLUCOSE-CAPILLARY: 125 mg/dL — AB (ref 65–99)

## 2017-01-26 ENCOUNTER — Encounter (HOSPITAL_COMMUNITY)
Admission: RE | Admit: 2017-01-26 | Discharge: 2017-01-26 | Disposition: A | Discharge status: Home / Self Care | Payer: Medicare Other | Source: Ambulatory Visit | Attending: Cardiology | Admitting: Cardiology

## 2017-01-26 DIAGNOSIS — I272 Pulmonary hypertension, unspecified: Secondary | ICD-10-CM | POA: Diagnosis not present

## 2017-01-26 LAB — GLUCOSE, CAPILLARY
Glucose-Capillary: 137 mg/dL — ABNORMAL HIGH (ref 65–99)
Glucose-Capillary: 128 mg/dL — ABNORMAL HIGH (ref 65–99)

## 2017-01-26 NOTE — Progress Notes (Signed)
Daily Session Note  Patient Details  Name: Catherine Vazquez MRN: [id number] Date of Birth: 10/15/1924 Referring Provider:     Pulmonary Rehab Walk Test from 01/03/2017 in [place]  Referring Provider  Dr. Einar Gip      Encounter Date: 01/26/2017  Check In:     Session Check In - 01/26/17 1345      Check-In   Location [facility]   Staff Present Trish Fountain, RN, Maxcine Ham, RN, Roque Cash, RN   Supervising physician immediately available to respond to emergencies [facility] immediately available   Physician(s) Dr. Sloan Leiter   Medication changes reported     No   Fall or balance concerns reported    No   Tobacco Cessation No Change   Warm-up and Cool-down Performed as group-led instruction   Resistance Training Performed Yes   VAD Patient? No     Pain Assessment   Currently in Pain? No/denies   Multiple Pain Sites No      Capillary Blood Glucose: Results for orders placed or performed during the hospital encounter of 01/26/17 (from the past 24 hour(s))  Glucose, capillary     Status: Abnormal   Collection Time: 01/26/17  1:31 PM  Result Value Ref Range   Glucose-Capillary 137 (H) 65 - 99 mg/dL  Glucose, capillary     Status: Abnormal   Collection Time: 01/26/17  3:23 PM  Result Value Ref Range   Glucose-Capillary 128 (H) 65 - 99 mg/dL        Exercise Prescription Changes - 01/26/17 1600      Response to Exercise   Blood Pressure (Admit) 126/40   Blood Pressure (Exercise) 128/70   Blood Pressure (Exit) 130/42   Heart Rate (Admit) 64 bpm   Heart Rate (Exercise) 71 bpm   Heart Rate (Exit) 61 bpm   Oxygen Saturation (Admit) 98 %   Oxygen Saturation (Exercise) 98 %   Oxygen Saturation (Exit) 98 %   Rating of Perceived Exertion (Exercise) 13   Perceived Dyspnea (Exercise) 1   Duration Progress to 45 minutes of aerobic exercise without signs/symptoms of physical distress   Intensity THRR unchanged      Progression   Progression Continue to progress workloads to maintain intensity without signs/symptoms of physical distress.     Resistance Training   Training Prescription Yes   Weight orange bands   Reps 10-15   Time 10 Minutes     Interval Training   Interval Training No     Oxygen   Oxygen Continuous   Liters 2     NuStep   Level 4   Minutes 17   METs 2     Track   Laps 8   Minutes 17      History  Smoking Status  . Never Smoker  Smokeless Tobacco  . Never Used    Goals Met:  Exercise tolerated well No report of cardiac concerns or symptoms Strength training completed today  Goals Unmet:  Not Applicable  Comments: Service time is from 1330 to 1530     Dr. Rush Farmer is Medical Director for Pulmonary Rehab at [facility].

## 2017-01-31 ENCOUNTER — Encounter (HOSPITAL_COMMUNITY)
Admission: RE | Admit: 2017-01-31 | Discharge: 2017-01-31 | Disposition: A | Discharge status: Home / Self Care | Payer: Medicare Other | Source: Ambulatory Visit | Attending: Cardiology | Admitting: Cardiology

## 2017-01-31 DIAGNOSIS — I272 Pulmonary hypertension, unspecified: Secondary | ICD-10-CM

## 2017-01-31 NOTE — Progress Notes (Signed)
Pulmonary Individual Treatment Plan  Patient Details  Name: Catherine Vazquez MRN: [id number] Date of Birth: 04/13/1925 Referring Provider:     Pulmonary Rehab Walk Test from 01/03/2017 in [facility] CARDI[facility]  Referring Provider  Dr. Jacinto Halim      Initial Encounter Date:    Pulmonary Rehab Walk Test from 01/03/2017 in [facility] CARDIAC REHAB  Date  01/03/17  Referring Provider  Dr. Jacinto Halim      Visit Diagnosis: Pulmonary hypertension (HCC)  Patient's Home Medications on Admission:   Current Outpatient Prescriptions:  .  amLODipine (NORVASC) 5 MG tablet, Take 5 mg by mouth daily., Disp: , Rfl:  .  aspirin EC 81 MG tablet, Take 81 mg by mouth daily., Disp: , Rfl:  .  carvedilol (COREG) 6.25 MG tablet, Take 6.25 mg by mouth 2 (two) times daily with a meal., Disp: , Rfl:  .  losartan (COZAAR) 100 MG tablet, Take 100 mg by mouth daily., Disp: , Rfl:  .  Misc Natural Products (OSTEO BI-FLEX/5-LOXIN ADVANCED PO), Take 1 tablet by mouth 2 (two) times daily. , Disp: , Rfl:  .  mometasone (NASONEX) 50 MCG/ACT nasal spray, Place 2 sprays into the nose daily., Disp: , Rfl:  .  Multiple Vitamins-Minerals (MULTIVITAMIN ADULTS) TABS, See admin instructions. Pack of vitamins:Take 1 pack by mouth once a day, Disp: , Rfl:  .  sitaGLIPtin (JANUVIA) 25 MG tablet, Take 25 mg by mouth daily., Disp: , Rfl:   Past Medical History: Past Medical History:  Diagnosis Date  . Arthritis   . CHF (congestive heart failure) (HCC)   . Diabetes mellitus without complication (HCC)   . Hypertension     Tobacco Use: History  Smoking Status  . Never Smoker  Smokeless Tobacco  . Never Used    Labs: Recent Review Flowsheet Data    There is no flowsheet data to display.      Capillary Blood Glucose: Lab Results  Component Value Date   GLUCAP 128 (H) 01/26/2017   GLUCAP 137 (H) 01/26/2017   GLUCAP 125 (H) 01/24/2017   GLUCAP 125 (H) 01/24/2017   GLUCAP 104 (H)  09/13/2013       POCT Glucose    Row Name 01/10/17 1508 01/26/17 1623           POCT Blood Glucose   Pre-Exercise 105 mg/dL 612 mg/dL      Post-Exercise  - 244 mg/dL         ADL UCSD:   Pulmonary Function Assessment:     Pulmonary Function Assessment - 12/30/16 1219      Breath   Bilateral Breath Sounds Clear   Shortness of Breath Yes;Limiting activity      Exercise Target Goals:    Exercise Program Goal: Individual exercise prescription set with THRR, safety & activity barriers. Participant demonstrates ability to understand and report RPE using BORG scale, to self-measure pulse accurately, and to acknowledge the importance of the exercise prescription.  Exercise Prescription Goal: Starting with aerobic activity 30 plus minutes a day, 3 days per week for initial exercise prescription. Provide home exercise prescription and guidelines that participant acknowledges understanding prior to discharge.  Activity Barriers & Risk Stratification:   6 Minute Walk:     6 Minute Walk    Row Name 01/03/17 1636         6 Minute Walk   Phase Initial     Distance 850 feet     Walk Time 6  minutes     # of Rest Breaks 0     MPH 1.6     METS 2.23     RPE 12     Perceived Dyspnea  1     Symptoms Yes (comment)     Comments all over fatigue     Resting HR 70 bpm     Resting BP 150/70     Max Ex. HR 110 bpm     Max Ex. BP 185/72     2 Minute Post BP 160/83       Interval HR   Baseline HR 70     1 Minute HR 82     2 Minute HR 82     3 Minute HR 110     4 Minute HR 110     5 Minute HR 86     6 Minute HR 86     Interval Heart Rate? Yes       Interval Oxygen   Interval Oxygen? Yes     Baseline Oxygen Saturation % 100 %     Baseline Liters of Oxygen 2 L     1 Minute Oxygen Saturation % 99 %     1 Minute Liters of Oxygen 2 L     2 Minute Oxygen Saturation % 100 %     2 Minute Liters of Oxygen 2 L     3 Minute Oxygen Saturation % 93 %     3 Minute Liters of  Oxygen 2 L     4 Minute Oxygen Saturation % 92 %     4 Minute Liters of Oxygen 2 L     5 Minute Oxygen Saturation % 90 %     5 Minute Liters of Oxygen 2 L     6 Minute Oxygen Saturation % 90 %     6 Minute Liters of Oxygen 2 L        Oxygen Initial Assessment:     Oxygen Initial Assessment - 01/03/17 1635      Home Oxygen   Home Oxygen Device E-Tanks;Home Concentrator   Sleep Oxygen Prescription Continuous   Liters per minute 2   Home Exercise Oxygen Prescription Continuous   Liters per minute 2   Home at Rest Exercise Oxygen Prescription Continuous   Liters per minute 2   Compliance with Home Oxygen Use Yes     Initial 6 min Walk   Oxygen Used Continuous;E-Tanks   Liters per minute 2   Resting Oxygen Saturation  during 6 min walk 100 %   Exercise Oxygen Saturation  during 6 min walk 90 %     Program Oxygen Prescription   Program Oxygen Prescription Continuous;E-Tanks   Liters per minute 2     Intervention   Short Term Goals To learn and exhibit compliance with exercise, home and travel O2 prescription      Oxygen Re-Evaluation:     Oxygen Re-Evaluation    Row Name 01/31/17 0905             Program Oxygen Prescription   Program Oxygen Prescription Continuous;E-Tanks       Liters per minute 2         Home Oxygen   Home Oxygen Device Home Concentrator;E-Tanks       Sleep Oxygen Prescription Continuous       Liters per minute 2       Home Exercise Oxygen Prescription Continuous       Liters per  minute 2       Home at Rest Exercise Oxygen Prescription Continuous       Liters per minute 2       Compliance with Home Oxygen Use Yes         Goals/Expected Outcomes   Short Term Goals To learn and exhibit compliance with exercise, home and travel O2 prescription       Long  Term Goals Exhibits compliance with exercise, home and travel O2 prescription       Comments is compliant with home oxygen       Goals/Expected Outcomes continued compliance           Oxygen Discharge (Final Oxygen Re-Evaluation):     Oxygen Re-Evaluation - 01/31/17 0905      Program Oxygen Prescription   Program Oxygen Prescription Continuous;E-Tanks   Liters per minute 2     Home Oxygen   Home Oxygen Device Home Concentrator;E-Tanks   Sleep Oxygen Prescription Continuous   Liters per minute 2   Home Exercise Oxygen Prescription Continuous   Liters per minute 2   Home at Rest Exercise Oxygen Prescription Continuous   Liters per minute 2   Compliance with Home Oxygen Use Yes     Goals/Expected Outcomes   Short Term Goals To learn and exhibit compliance with exercise, home and travel O2 prescription   Long  Term Goals Exhibits compliance with exercise, home and travel O2 prescription   Comments is compliant with home oxygen   Goals/Expected Outcomes continued compliance      Initial Exercise Prescription:     Initial Exercise Prescription - 01/03/17 1600      Date of Initial Exercise RX and Referring Provider   Date 01/03/17   Referring Provider Dr. Jacinto Halim     Oxygen   Oxygen Continuous   Liters 2     NuStep   Level 2   Minutes 17   METs 1.5     Arm Ergometer   Level 2   Minutes 17     Track   Laps 5   Minutes 17     Prescription Details   Frequency (times per week) 2   Duration Progress to 45 minutes of aerobic exercise without signs/symptoms of physical distress     Intensity   THRR 40-80% of Max Heartrate 52-105   Ratings of Perceived Exertion 11-13   Perceived Dyspnea 0-4     Progression   Progression Continue progressive overload as per policy without signs/symptoms or physical distress.     Resistance Training   Training Prescription Yes   Weight orange bands   Reps 10-15      Perform Capillary Blood Glucose checks as needed.  Exercise Prescription Changes:     Exercise Prescription Changes    Row Name 01/10/17 1500 01/12/17 1600 01/17/17 1553 01/19/17 1500 01/24/17 1500     Response to Exercise   Blood  Pressure (Admit) 124/60 138/60 140/50 122/60 128/50   Blood Pressure (Exercise) 136/70 120/64 142/60 138/68 152/64   Blood Pressure (Exit) 108/54 116/60 112/58 132/60 130/60   Heart Rate (Admit) 55 bpm 69 bpm 62 bpm 67 bpm 61 bpm   Heart Rate (Exercise) 112 bpm 65 bpm 69 bpm 70 bpm 73 bpm   Heart Rate (Exit) 60 bpm 60 bpm 66 bpm 69 bpm 58 bpm   Oxygen Saturation (Admit) 97 % 100 % 100 % 98 % 92 %   Oxygen Saturation (Exercise) 90 % 100 % 97 % 100 %  99 %   Oxygen Saturation (Exit) 100 % 100 % 100 % 98 % 100 %   Rating of Perceived Exertion (Exercise) 11 13 13 15 13    Perceived Dyspnea (Exercise) 1 2 1 1 1    Duration Progress to 45 minutes of aerobic exercise without signs/symptoms of physical distress Progress to 45 minutes of aerobic exercise without signs/symptoms of physical distress Progress to 45 minutes of aerobic exercise without signs/symptoms of physical distress Progress to 45 minutes of aerobic exercise without signs/symptoms of physical distress Progress to 45 minutes of aerobic exercise without signs/symptoms of physical distress   Intensity THRR unchanged THRR unchanged THRR unchanged THRR unchanged THRR unchanged     Resistance Training   Training Prescription Yes Yes Yes Yes Yes   Weight orange bands orange bands orange bands orange bands orange bands   Reps 10-15 10-15 10-15 10-15 10-15   Time 10 Minutes 10 Minutes 10 Minutes 10 Minutes 10 Minutes     Interval Training   Interval Training  - No No No No     Oxygen   Oxygen Continuous Continuous Continuous Continuous Continuous   Liters 2 2 2 2 2      NuStep   Level 2 2 2 2 4    Minutes 17 17 17 17 17    METs 1.8 1.7 1.6 1.7 1.9     Arm Ergometer   Level 2  - 2  - 2   Minutes 17  - 17  - 17     Track   Laps 7 6 8 5 6    Minutes 17 17 17 17 17    Row Name 01/26/17 1600             Response to Exercise   Blood Pressure (Admit) 126/40       Blood Pressure (Exercise) 128/70       Blood Pressure (Exit) 130/42        Heart Rate (Admit) 64 bpm       Heart Rate (Exercise) 71 bpm       Heart Rate (Exit) 61 bpm       Oxygen Saturation (Admit) 98 %       Oxygen Saturation (Exercise) 98 %       Oxygen Saturation (Exit) 98 %       Rating of Perceived Exertion (Exercise) 13       Perceived Dyspnea (Exercise) 1       Duration Progress to 45 minutes of aerobic exercise without signs/symptoms of physical distress       Intensity THRR unchanged         Progression   Progression Continue to progress workloads to maintain intensity without signs/symptoms of physical distress.         Resistance Training   Training Prescription Yes       Weight orange bands       Reps 10-15       Time 10 Minutes         Interval Training   Interval Training No         Oxygen   Oxygen Continuous       Liters 2         NuStep   Level 4       Minutes 17       METs 2         Track   Laps 8       Minutes 17  Exercise Comments:   Exercise Goals and Review:     Exercise Goals    Row Name 12/30/16 1040             Exercise Goals   Increase Physical Activity Yes       Intervention Provide advice, education, support and counseling about physical activity/exercise needs.;Develop an individualized exercise prescription for aerobic and resistive training based on initial evaluation findings, risk stratification, comorbidities and participant's personal goals.       Expected Outcomes Achievement of increased cardiorespiratory fitness and enhanced flexibility, muscular endurance and strength shown through measurements of functional capacity and personal statement of participant.       Increase Strength and Stamina Yes       Intervention Provide advice, education, support and counseling about physical activity/exercise needs.;Develop an individualized exercise prescription for aerobic and resistive training based on initial evaluation findings, risk stratification, comorbidities and participant's personal  goals.       Expected Outcomes Achievement of increased cardiorespiratory fitness and enhanced flexibility, muscular endurance and strength shown through measurements of functional capacity and personal statement of participant.          Exercise Goals Re-Evaluation :     Exercise Goals Re-Evaluation    Row Name 01/30/17 1302             Exercise Goal Re-Evaluation   Exercise Goals Review Increase Strenth and Stamina;Increase Physical Activity       Comments Patient is progressing low and slow in regards to workload intensities. Vitals are great, rate of perceived exertion levels are fairly light to somewhat hard. Will cont. to monitor patient and increase workloads as appropriate.        Expected Outcomes Through exercising at rehab and at home, the patient will be able to increase strength and stamina which will make ADL's easier to preform.          Discharge Exercise Prescription (Final Exercise Prescription Changes):     Exercise Prescription Changes - 01/26/17 1600      Response to Exercise   Blood Pressure (Admit) 126/40   Blood Pressure (Exercise) 128/70   Blood Pressure (Exit) 130/42   Heart Rate (Admit) 64 bpm   Heart Rate (Exercise) 71 bpm   Heart Rate (Exit) 61 bpm   Oxygen Saturation (Admit) 98 %   Oxygen Saturation (Exercise) 98 %   Oxygen Saturation (Exit) 98 %   Rating of Perceived Exertion (Exercise) 13   Perceived Dyspnea (Exercise) 1   Duration Progress to 45 minutes of aerobic exercise without signs/symptoms of physical distress   Intensity THRR unchanged     Progression   Progression Continue to progress workloads to maintain intensity without signs/symptoms of physical distress.     Resistance Training   Training Prescription Yes   Weight orange bands   Reps 10-15   Time 10 Minutes     Interval Training   Interval Training No     Oxygen   Oxygen Continuous   Liters 2     NuStep   Level 4   Minutes 17   METs 2     Track   Laps 8    Minutes 17      Nutrition:  Target Goals: Understanding of nutrition guidelines, daily intake of sodium 1500mg , cholesterol 200mg , calories 30% from fat and 7% or less from saturated fats, daily to have 5 or more servings of fruits and vegetables.  Biometrics:     Pre Biometrics - 12/30/16 1040  Pre Biometrics   Grip Strength 15 kg       Nutrition Therapy Plan and Nutrition Goals:   Nutrition Discharge: Rate Your Plate Scores:   Nutrition Goals Re-Evaluation:   Nutrition Goals Discharge (Final Nutrition Goals Re-Evaluation):   Psychosocial: Target Goals: Acknowledge presence or absence of significant depression and/or stress, maximize coping skills, provide positive support system. Participant is able to verbalize types and ability to use techniques and skills needed for reducing stress and depression.  Initial Review & Psychosocial Screening:     Initial Psych Review & Screening - 12/30/16 1220      Initial Review   Current issues with Current Depression  per [facility] 2/9     Family Dynamics   Good Support System? Yes   Concerns Recent loss of significant other   Comments loss of child in 2006     Barriers   Psychosocial barriers to participate in program There are no identifiable barriers or psychosocial needs.     Screening Interventions   Interventions Encouraged to exercise      Quality of Life Scores:   PHQ-9: Recent Review Flowsheet Data    Depression screen [facility] 2/9 12/30/2016   Decreased Interest 1   Down, Depressed, Hopeless 1   PHQ - 2 Score 2   Altered sleeping 1   Tired, decreased energy 3   Change in appetite 2   Feeling bad or failure about yourself  0   Trouble concentrating 0   Moving slowly or fidgety/restless 0   Suicidal thoughts 0   PHQ-9 Score 8   Difficult doing work/chores Not difficult at all     Interpretation of Total Score  Total Score Depression Severity:  1-4 = Minimal depression, 5-9 = Mild depression, 10-14 =  Moderate depression, 15-19 = Moderately severe depression, 20-27 = Severe depression   Psychosocial Evaluation and Intervention:   Psychosocial Re-Evaluation:     Psychosocial Re-Evaluation    Row Name 01/31/17 0908             Psychosocial Re-Evaluation   Current issues with None Identified       Interventions Encouraged to attend Pulmonary Rehabilitation for the exercise       Continue Psychosocial Services  No Follow up required          Psychosocial Discharge (Final Psychosocial Re-Evaluation):     Psychosocial Re-Evaluation - 01/31/17 0908      Psychosocial Re-Evaluation   Current issues with None Identified   Interventions Encouraged to attend Pulmonary Rehabilitation for the exercise   Continue Psychosocial Services  No Follow up required      Education: Education Goals: Education classes will be provided on a weekly basis, covering required topics. Participant will state understanding/return demonstration of topics presented.  Learning Barriers/Preferences:     Learning Barriers/Preferences - 12/30/16 1126      Learning Barriers/Preferences   Learning Barriers None   Learning Preferences Computer/Internet;Group Instruction;Verbal Instruction;Written Material      Education Topics: Risk Factor Reduction:  -Group instruction that is supported by a PowerPoint presentation. Instructor discusses the definition of a risk factor, different risk factors for pulmonary disease, and how the heart and lungs work together.     Nutrition for Pulmonary Patient:  -Group instruction provided by PowerPoint slides, verbal discussion, and written materials to support subject matter. The instructor gives an explanation and review of healthy diet recommendations, which includes a discussion on weight management, recommendations for fruit and vegetable consumption, as well as protein,  fluid, caffeine, fiber, sodium, sugar, and alcohol. Tips for eating when patients are short of  breath are discussed.   Pursed Lip Breathing:  -Group instruction that is supported by demonstration and informational handouts. Instructor discusses the benefits of pursed lip and diaphragmatic breathing and detailed demonstration on how to preform both.     Oxygen Safety:  -Group instruction provided by PowerPoint, verbal discussion, and written material to support subject matter. There is an overview of "What is Oxygen" and "Why do we need it".  Instructor also reviews how to create a safe environment for oxygen use, the importance of using oxygen as prescribed, and the risks of noncompliance. There is a brief discussion on traveling with oxygen and resources the patient may utilize.   Oxygen Equipment:  -Group instruction provided by [facility] Staff utilizing handouts, written materials, and equipment demonstrations.   Signs and Symptoms:  -Group instruction provided by written material and verbal discussion to support subject matter. Warning signs and symptoms of infection, stroke, and heart attack are reviewed and when to call the physician/911 reinforced. Tips for preventing the spread of infection discussed.   PULMONARY REHAB OTHER RESPIRATORY from 01/26/2017 in [facility] CARDIAC REHAB  Date  01/26/17  Educator  RN  Instruction Review Code  2- meets goals/outcomes      Advanced Directives:  -Group instruction provided by verbal instruction and written material to support subject matter. Instructor reviews Advanced Directive laws and proper instruction for filling out document.   Pulmonary Video:  -Group video education that reviews the importance of medication and oxygen compliance, exercise, good nutrition, pulmonary hygiene, and pursed lip and diaphragmatic breathing for the pulmonary patient.   Exercise for the Pulmonary Patient:  -Group instruction that is supported by a PowerPoint presentation. Instructor discusses benefits of exercise, core components  of exercise, frequency, duration, and intensity of an exercise routine, importance of utilizing pulse oximetry during exercise, safety while exercising, and options of places to exercise outside of rehab.     PULMONARY REHAB OTHER RESPIRATORY from 01/26/2017 in [facility] CARDIAC REHAB  Date  01/12/17  Educator  EP  Instruction Review Code  2- meets goals/outcomes      Pulmonary Medications:  -Verbally interactive group education provided by instructor with focus on inhaled medications and proper administration.   Anatomy and Physiology of the Respiratory System and Intimacy:  -Group instruction provided by PowerPoint, verbal discussion, and written material to support subject matter. Instructor reviews respiratory cycle and anatomical components of the respiratory system and their functions. Instructor also reviews differences in obstructive and restrictive respiratory diseases with examples of each. Intimacy, Sex, and Sexuality differences are reviewed with a discussion on how relationships can change when diagnosed with pulmonary disease. Common sexual concerns are reviewed.   Knowledge Questionnaire Score:   Core Components/Risk Factors/Patient Goals at Admission:     Personal Goals and Risk Factors at Admission - 12/30/16 1219      Core Components/Risk Factors/Patient Goals on Admission   Improve shortness of breath with ADL's Yes   Intervention Provide education, individualized exercise plan and daily activity instruction to help decrease symptoms of SOB with activities of daily living.   Expected Outcomes Short Term: Achieves a reduction of symptoms when performing activities of daily living.   Develop more efficient breathing techniques such as purse lipped breathing and diaphragmatic breathing; and practicing self-pacing with activity Yes   Intervention Provide education, demonstration and support about specific breathing techniuqes utilized for  more efficient  breathing. Include techniques such as pursed lipped breathing, diaphragmatic breathing and self-pacing activity.   Expected Outcomes Short Term: Participant will be able to demonstrate and use breathing techniques as needed throughout daily activities.   Hypertension Yes   Intervention Provide education on lifestyle modifcations including regular physical activity/exercise, weight management, moderate sodium restriction and increased consumption of fresh fruit, vegetables, and low fat dairy, alcohol moderation, and smoking cessation.;Monitor prescription use compliance.   Expected Outcomes Short Term: Continued assessment and intervention until BP is < 140/70mm HG in hypertensive participants. < 130/29mm HG in hypertensive participants with diabetes, heart failure or chronic kidney disease.;Long Term: Maintenance of blood pressure at goal levels.      Core Components/Risk Factors/Patient Goals Review:      Goals and Risk Factor Review    Row Name 01/31/17 0907             Core Components/Risk Factors/Patient Goals Review   Personal Goals Review Hypertension;Improve shortness of breath with ADL's;Develop more efficient breathing techniques such as purse lipped breathing and diaphragmatic breathing and practicing self-pacing with activity.       Review blood pressure is controlled, just started program, performing PLB, too early to see dramatic improvements       Expected Outcomes Expect to see more improvement in goals in next 30 days          Core Components/Risk Factors/Patient Goals at Discharge (Final Review):      Goals and Risk Factor Review - 01/31/17 0907      Core Components/Risk Factors/Patient Goals Review   Personal Goals Review Hypertension;Improve shortness of breath with ADL's;Develop more efficient breathing techniques such as purse lipped breathing and diaphragmatic breathing and practicing self-pacing with activity.   Review blood pressure is controlled, just started  program, performing PLB, too early to see dramatic improvements   Expected Outcomes Expect to see more improvement in goals in next 30 days      ITP Comments:   Comments: ITP REVIEW Pt is making expected progress toward pulmonary rehab goals after completing 6 sessions. Recommend continued exercise, life style modification, education, and utilization of breathing techniques to increase stamina and strength and decrease shortness of breath with exertion.

## 2017-02-02 ENCOUNTER — Encounter (HOSPITAL_COMMUNITY)
Admission: RE | Admit: 2017-02-02 | Discharge: 2017-02-02 | Disposition: A | Discharge status: Home / Self Care | Payer: Medicare Other | Source: Ambulatory Visit | Attending: Cardiology | Admitting: Cardiology

## 2017-02-02 VITALS — Wt 165.1 lb

## 2017-02-02 DIAGNOSIS — I272 Pulmonary hypertension, unspecified: Secondary | ICD-10-CM | POA: Diagnosis not present

## 2017-02-02 LAB — GLUCOSE, CAPILLARY
Glucose-Capillary: 138 mg/dL — ABNORMAL HIGH (ref 65–99)
Glucose-Capillary: 118 mg/dL — ABNORMAL HIGH (ref 65–99)

## 2017-02-02 NOTE — Progress Notes (Signed)
Daily Session Note  Patient Details  Name: Catherine Vazquez MRN: [id number] Date of Birth: [birth year] Referring Provider:     Pulmonary Rehab Walk Test from 01/03/2017 in [place]  Referring Provider  Dr. Einar Gip      Encounter Date: 02/02/2017  Check In:     Session Check In - 02/02/17 1330      Check-In   Location [facility]   Staff Present Rosebud Poles, RN, BSN;Lisa Ysidro Evert, RN;Portia Rollene Rotunda, RN, BSN   Supervising physician immediately available to respond to emergencies [facility] immediately available   Physician(s) Dr. Wendee Beavers   Medication changes reported     No   Fall or balance concerns reported    No   Tobacco Cessation No Change   Warm-up and Cool-down Performed as group-led instruction   Resistance Training Performed Yes   VAD Patient? No     Pain Assessment   Currently in Pain? No/denies   Multiple Pain Sites No      Capillary Blood Glucose: Results for orders placed or performed during the hospital encounter of 02/02/17 (from the past 24 hour(s))  Glucose, capillary     Status: Abnormal   Collection Time: 02/02/17  2:28 PM  Result Value Ref Range   Glucose-Capillary 138 (H) 65 - 99 mg/dL  Glucose, capillary     Status: Abnormal   Collection Time: 02/02/17  3:09 PM  Result Value Ref Range   Glucose-Capillary 118 (H) 65 - 99 mg/dL       POCT Glucose - 02/02/17 1616      POCT Blood Glucose   Pre-Exercise 138 mg/dL   Post-Exercise 118 mg/dL         Exercise Prescription Changes - 02/02/17 1600      Response to Exercise   Blood Pressure (Admit) 130/54   Blood Pressure (Exercise) 136/80   Blood Pressure (Exit) 118/60   Heart Rate (Admit) 55 bpm   Heart Rate (Exercise) 65 bpm   Heart Rate (Exit) 69 bpm   Oxygen Saturation (Admit) 98 %   Oxygen Saturation (Exercise) 98 %   Oxygen Saturation (Exit) 100 %   Rating of Perceived Exertion (Exercise) 13   Perceived Dyspnea (Exercise) 3   Duration Progress to 45 minutes of aerobic exercise without signs/symptoms of physical distress   Intensity THRR unchanged     Progression   Progression Continue to progress workloads to maintain intensity without signs/symptoms of physical distress.     Resistance Training   Training Prescription Yes   Weight orange bands   Reps 10-15   Time 10 Minutes     Interval Training   Interval Training No     Oxygen   Oxygen Continuous   Liters 2     Arm Ergometer   Level 4   Minutes 17     Track   Laps 6   Minutes 17      History  Smoking Status  . Never Smoker  Smokeless Tobacco  . Never Used    Goals Met:  Exercise tolerated well Strength training completed today  Goals Unmet:  Not Applicable  Comments: Service time is from 1330 to 1530    Dr. Rush Farmer is Medical Director for Pulmonary Rehab at [facility].

## 2017-02-07 ENCOUNTER — Encounter (HOSPITAL_COMMUNITY)
Admission: RE | Admit: 2017-02-07 | Discharge: 2017-02-07 | Disposition: A | Discharge status: Home / Self Care | Payer: Medicare Other | Source: Ambulatory Visit | Attending: Cardiology | Admitting: Cardiology

## 2017-02-07 VITALS — Wt 164.2 lb

## 2017-02-07 DIAGNOSIS — I272 Pulmonary hypertension, unspecified: Secondary | ICD-10-CM

## 2017-02-07 LAB — GLUCOSE, CAPILLARY
Glucose-Capillary: 104 mg/dL — ABNORMAL HIGH (ref 65–99)
Glucose-Capillary: 126 mg/dL — ABNORMAL HIGH (ref 65–99)

## 2017-02-07 NOTE — Progress Notes (Signed)
I have reviewed a Home Exercise Prescription with [name] . Catherine Vazquez is currently exercising at home.  The patient was advised to walk 2-3 days a week for 15-20 minutes.  Roshni and I discussed how to progress their exercise prescription.  The patient stated that their goals were to increase daily energy and decrease sedentary time.  The patient stated that they understand the exercise prescription.  We reviewed exercise guidelines, target heart rate during exercise, oxygen use, weather, home pulse oximeter, endpoints for exercise, and goals.  Patient is encouraged to come to me with any questions. I will continue to follow up with the patient to assist them with progression and safety.

## 2017-02-07 NOTE — Progress Notes (Signed)
Daily Session Note  Patient Details  Name: VERNETTA DIZDAREVIC MRN: [id number] Date of Birth: [birth year] Referring Provider:     Pulmonary Rehab Walk Test from 01/03/2017 in [place]  Referring Provider  Dr. Einar Gip      Encounter Date: 02/07/2017  Check In:     Session Check In - 02/07/17 1527      Check-In   Location [facility]   Staff Present Trish Fountain, RN, Maxcine Ham, RN, BSN;Molly diVincenzo, MS, ACSM RCEP, Exercise Physiologist;Filippo Puls Ysidro Evert, RN   Supervising physician immediately available to respond to emergencies [facility] immediately available   Physician(s) Dr. Wendee Beavers   Medication changes reported     No   Fall or balance concerns reported    No   Tobacco Cessation No Change   Warm-up and Cool-down Performed as group-led instruction   Resistance Training Performed Yes   VAD Patient? No     Pain Assessment   Currently in Pain? No/denies   Multiple Pain Sites No      Capillary Blood Glucose: Results for orders placed or performed during the hospital encounter of 02/07/17 (from the past 24 hour(s))  Glucose, capillary     Status: Abnormal   Collection Time: 02/07/17  1:37 PM  Result Value Ref Range   Glucose-Capillary 104 (H) 65 - 99 mg/dL  Glucose, capillary     Status: Abnormal   Collection Time: 02/07/17  2:58 PM  Result Value Ref Range   Glucose-Capillary 126 (H) 65 - 99 mg/dL       POCT Glucose - 02/07/17 1532      POCT Blood Glucose   Pre-Exercise 104 mg/dL  Pt given bananna before exercise   Post-Exercise 126 mg/dL         Exercise Prescription Changes - 02/07/17 1500      Response to Exercise   Blood Pressure (Admit) 136/60   Blood Pressure (Exercise) 120/52   Blood Pressure (Exit) 122/64   Heart Rate (Admit) 63 bpm   Heart Rate (Exercise) 71 bpm   Heart Rate (Exit) 64 bpm   Oxygen Saturation (Admit) 100 %   Oxygen Saturation (Exercise) 100 %   Oxygen Saturation (Exit) 100  %   Rating of Perceived Exertion (Exercise) 13   Perceived Dyspnea (Exercise) 2   Duration Progress to 45 minutes of aerobic exercise without signs/symptoms of physical distress   Intensity THRR unchanged     Progression   Progression Continue to progress workloads to maintain intensity without signs/symptoms of physical distress.     Resistance Training   Training Prescription Yes   Weight orange bands   Reps 10-15   Time 10 Minutes     Interval Training   Interval Training No     Oxygen   Oxygen Continuous   Liters 2     NuStep   Level 3   Minutes 17   METs 1.5     Arm Ergometer   Level 4   Minutes 17     Track   Laps 8   Minutes 17      History  Smoking Status  . Never Smoker  Smokeless Tobacco  . Never Used    Goals Met:  No report of cardiac concerns or symptoms Strength training completed today  Goals Unmet:  Not Applicable  Comments: Service time is from 1330 to 1500    Dr. Rush Farmer is Medical Director for Pulmonary Rehab at [facility].

## 2017-02-09 ENCOUNTER — Encounter (HOSPITAL_COMMUNITY)
Admission: RE | Admit: 2017-02-09 | Discharge: 2017-02-09 | Disposition: A | Discharge status: Home / Self Care | Payer: Medicare Other | Source: Ambulatory Visit | Attending: Cardiology | Admitting: Cardiology

## 2017-02-09 VITALS — Wt 165.1 lb

## 2017-02-09 DIAGNOSIS — I272 Pulmonary hypertension, unspecified: Secondary | ICD-10-CM

## 2017-02-09 LAB — GLUCOSE, CAPILLARY
Glucose-Capillary: 142 mg/dL — ABNORMAL HIGH (ref 65–99)
Glucose-Capillary: 115 mg/dL — ABNORMAL HIGH (ref 65–99)

## 2017-02-09 NOTE — Progress Notes (Signed)
Daily Session Note  Patient Details  Name: Catherine Vazquez MRN: [id number] Date of Birth: 03/28/1925 Referring Provider:     Pulmonary Rehab Walk Test from 01/03/2017 in [place]  Referring Provider  Dr. Einar Gip      Encounter Date: 02/09/2017  Check In:     Session Check In - 02/09/17 1337      Check-In   Location [facility]   Staff Present Rosebud Poles, RN, BSN;Molly diVincenzo, MS, ACSM RCEP, Exercise Physiologist;Kelliann Pendergraph Ysidro Evert, RN   Supervising physician immediately available to respond to emergencies [facility] immediately available   Physician(s) Dr. Broadus John   Medication changes reported     No   Fall or balance concerns reported    No   Tobacco Cessation No Change   Warm-up and Cool-down Performed as group-led instruction   Resistance Training Performed Yes   VAD Patient? No     Pain Assessment   Currently in Pain? No/denies   Multiple Pain Sites No      Capillary Blood Glucose: Results for orders placed or performed during the hospital encounter of 02/09/17 (from the past 24 hour(s))  Glucose, capillary     Status: Abnormal   Collection Time: 02/09/17  2:58 PM  Result Value Ref Range   Glucose-Capillary 115 (H) 65 - 99 mg/dL        Exercise Prescription Changes - 02/09/17 1500      Response to Exercise   Blood Pressure (Admit) 132/50   Blood Pressure (Exercise) 124/50   Blood Pressure (Exit) 126/60   Heart Rate (Admit) 67 bpm   Heart Rate (Exercise) 79 bpm   Heart Rate (Exit) 69 bpm   Oxygen Saturation (Admit) 97 %   Oxygen Saturation (Exercise) 99 %   Oxygen Saturation (Exit) 99 %   Rating of Perceived Exertion (Exercise) 13   Perceived Dyspnea (Exercise) 3   Duration Progress to 45 minutes of aerobic exercise without signs/symptoms of physical distress   Intensity THRR unchanged     Progression   Progression Continue to progress workloads to maintain intensity without signs/symptoms of  physical distress.     Resistance Training   Training Prescription Yes   Weight orange bands   Reps 10-15   Time 10 Minutes     Interval Training   Interval Training No     Oxygen   Oxygen Continuous   Liters 2     NuStep   Level 4   Minutes 17   METs 1.8     Track   Laps 8   Minutes 17      History  Smoking Status  . Never Smoker  Smokeless Tobacco  . Never Used    Goals Met:  Exercise tolerated well No report of cardiac concerns or symptoms Strength training completed today  Goals Unmet:  Not Applicable  Comments: Service time is from 1330 to 1510    Dr. Rush Farmer is Medical Director for Pulmonary Rehab at [facility].

## 2017-02-14 ENCOUNTER — Encounter (HOSPITAL_COMMUNITY)
Admission: RE | Admit: 2017-02-14 | Discharge: 2017-02-14 | Disposition: A | Discharge status: Home / Self Care | Payer: Medicare Other | Source: Ambulatory Visit | Attending: Cardiology | Admitting: Cardiology

## 2017-02-14 DIAGNOSIS — I272 Pulmonary hypertension, unspecified: Secondary | ICD-10-CM | POA: Diagnosis not present

## 2017-02-14 LAB — GLUCOSE, CAPILLARY: GLUCOSE-CAPILLARY: 124 mg/dL — AB (ref 65–99)

## 2017-02-14 NOTE — Progress Notes (Signed)
Daily Session Note  Patient Details  Name: Catherine Vazquez MRN: [id number] Date of Birth: [birth year] Referring Provider:     Pulmonary Rehab Walk Test from 01/03/2017 in [place]  Referring Provider  Dr. Einar Gip      Encounter Date: 02/14/2017  Check In:     Session Check In - 02/14/17 1509      Check-In   [facility]   Staff Present Rosebud Poles, RN, Luisa Hart, RN, Roque Cash, RN   Supervising physician immediately available to respond to emergencies [facility] immediately available   Physician(s) Dr. Broadus John   Medication changes reported     No   Fall or balance concerns reported    No   Tobacco Cessation No Change   Warm-up and Cool-down Performed as group-led instruction   Resistance Training Performed Yes   VAD Patient? No     Pain Assessment   Currently in Pain? No/denies   Multiple Pain Sites No      Capillary Blood Glucose: Results for orders placed or performed during the hospital encounter of 02/14/17 (from the past 24 hour(s))  Glucose, capillary     Status: Abnormal   Collection Time: 02/14/17  2:49 PM  Result Value Ref Range   Glucose-Capillary 124 (H) 65 - 99 mg/dL       POCT Glucose - 02/14/17 1512      POCT Blood Glucose   Pre-Exercise 123 mg/dL   Post-Exercise 124 mg/dL         Exercise Prescription Changes - 02/14/17 1500      Response to Exercise   Blood Pressure (Admit) 124/60   Blood Pressure (Exercise) 130/70   Blood Pressure (Exit) 110/56   Heart Rate (Admit) 62 bpm   Heart Rate (Exercise) 59 bpm   Heart Rate (Exit) 63 bpm   Oxygen Saturation (Admit) 100 %   Oxygen Saturation (Exercise) 98 %   Oxygen Saturation (Exit) 93 %   Rating of Perceived Exertion (Exercise) 13   Perceived Dyspnea (Exercise) 3   Duration Progress to 45 minutes of aerobic exercise without signs/symptoms of physical distress   Intensity THRR unchanged     Progression   Progression Continue to progress workloads to maintain intensity without signs/symptoms of physical distress.     Resistance Training   Training Prescription Yes   Weight orange bands   Reps 10-15   Time 10 Minutes     Interval Training   Interval Training No     Oxygen   Oxygen Continuous   Liters 2     NuStep   Level 4   Minutes 17   METs 2.1     Arm Ergometer   Level 4   Minutes 17     Track   Laps 6   Minutes 17      History  Smoking Status  . Never Smoker  Smokeless Tobacco  . Never Used    Goals Met:  Exercise tolerated well No report of cardiac concerns or symptoms Strength training completed today  Goals Unmet:  Not Applicable  Comments: Service time is from 1330 to 1500    Dr. Rush Farmer is Medical Director for Pulmonary Rehab at [facility].

## 2017-02-15 LAB — GLUCOSE, CAPILLARY: Glucose-Capillary: 123 mg/dL — ABNORMAL HIGH (ref 65–99)

## 2017-02-16 ENCOUNTER — Encounter (HOSPITAL_COMMUNITY)
Admission: RE | Admit: 2017-02-16 | Discharge: 2017-02-16 | Disposition: A | Discharge status: Home / Self Care | Payer: Medicare Other | Source: Ambulatory Visit | Attending: Cardiology | Admitting: Cardiology

## 2017-02-16 DIAGNOSIS — I272 Pulmonary hypertension, unspecified: Secondary | ICD-10-CM | POA: Diagnosis not present

## 2017-02-16 LAB — GLUCOSE, CAPILLARY
GLUCOSE-CAPILLARY: 136 mg/dL — AB (ref 65–99)
GLUCOSE-CAPILLARY: 73 mg/dL (ref 65–99)
Glucose-Capillary: 89 mg/dL (ref 65–99)

## 2017-02-16 NOTE — Progress Notes (Signed)
Daily Session Note  Patient Details  Name: Catherine Vazquez MRN: [id number] Date of Birth: 03/04/1925 Referring Provider:     Pulmonary Rehab Walk Test from 01/03/2017 in [facility]  Referring Provider  Dr. Ganji      Encounter Date: 02/16/2017  Check In:     Session Check In - 02/16/17 1330      Check-In   Location [facility]   Staff Present Joan Behrens, RN, BSN;Portia Payne, RN, BSN   Supervising physician immediately available to respond to emergencies [facility] immediately available   Physician(s) Dr. Akula   Medication changes reported     No   Fall or balance concerns reported    No   Tobacco Cessation No Change   Warm-up and Cool-down Performed as group-led instruction   Resistance Training Performed Yes   VAD Patient? No     Pain Assessment   Currently in Pain? No/denies   Multiple Pain Sites No      Capillary Blood Glucose: Results for orders placed or performed during the hospital encounter of 02/16/17 (from the past 24 hour(s))  Glucose, capillary     Status: Abnormal   Collection Time: 02/16/17  1:26 PM  Result Value Ref Range   Glucose-Capillary 136 (H) 65 - 99 mg/dL  Glucose, capillary     Status: None   Collection Time: 02/16/17  3:18 PM  Result Value Ref Range   Glucose-Capillary 73 65 - 99 mg/dL  Glucose, capillary     Status: None   Collection Time: 02/16/17  3:37 PM  Result Value Ref Range   Glucose-Capillary 89 65 - 99 mg/dL        Exercise Prescription Changes - 02/16/17 1600      Response to Exercise   Blood Pressure (Admit) 114/52   Blood Pressure (Exercise) 162/62   Blood Pressure (Exit) 140/70   Heart Rate (Admit) 53 bpm   Heart Rate (Exercise) 69 bpm   Heart Rate (Exit) 59 bpm   Oxygen Saturation (Admit) 100 %   Oxygen Saturation (Exercise) 98 %   Oxygen Saturation (Exit) 100 %   Rating of Perceived Exertion (Exercise) 9   Perceived Dyspnea (Exercise) 1   Duration  Progress to 45 minutes of aerobic exercise without signs/symptoms of physical distress   Intensity THRR unchanged     Progression   Progression Continue to progress workloads to maintain intensity without signs/symptoms of physical distress.     Resistance Training   Training Prescription Yes   Weight orange bands   Reps 10-15   Time 10 Minutes     Interval Training   Interval Training No     Oxygen   Oxygen Continuous   Liters 2     NuStep   Level 4   Minutes 17     Track   Laps 7   Minutes 17      History  Smoking Status  . Never Smoker  Smokeless Tobacco  . Never Used    Goals Met:  Exercise tolerated well No report of cardiac concerns or symptoms Strength training completed today  Goals Unmet:  Not Applicable  Comments: .Service time is from 1330 to 1535      Dr. Wesam G. Yacoub is Medical Director for Pulmonary Rehab at [facility]. 

## 2017-02-21 ENCOUNTER — Encounter (HOSPITAL_COMMUNITY)
Admission: RE | Admit: 2017-02-21 | Discharge: 2017-02-21 | Disposition: A | Discharge status: Home / Self Care | Payer: Medicare Other | Source: Ambulatory Visit | Attending: Cardiology | Admitting: Cardiology

## 2017-02-21 VITALS — Wt 168.2 lb

## 2017-02-21 DIAGNOSIS — I272 Pulmonary hypertension, unspecified: Secondary | ICD-10-CM | POA: Diagnosis not present

## 2017-02-21 LAB — GLUCOSE, CAPILLARY: GLUCOSE-CAPILLARY: 71 mg/dL (ref 65–99)

## 2017-02-21 NOTE — Progress Notes (Signed)
Daily Session Note  Patient Details  Name: Catherine Vazquez MRN: [id number] Date of Birth: 11/12/1924 Referring Provider:     Pulmonary Rehab Walk Test from 01/03/2017 in [place]  Referring Provider  Dr. Einar Gip      Encounter Date: 02/21/2017  Check In:     Session Check In - 02/21/17 1324      Check-In   Location [facility]   Staff Present Trish Fountain, RN, Maxcine Ham, RN, BSN;Molly diVincenzo, MS, ACSM RCEP, Exercise Physiologist   Supervising physician immediately available to respond to emergencies [facility] immediately available   Physician(s) Dr. Karleen Hampshire   Medication changes reported     No   Fall or balance concerns reported    No   Tobacco Cessation No Change   Warm-up and Cool-down Performed as group-led instruction   Resistance Training Performed Yes   VAD Patient? No     Pain Assessment   Currently in Pain? No/denies   Multiple Pain Sites No      Capillary Blood Glucose: Results for orders placed or performed during the hospital encounter of 02/21/17 (from the past 24 hour(s))  Glucose, capillary     Status: None   Collection Time: 02/21/17  3:01 PM  Result Value Ref Range   Glucose-Capillary 71 65 - 99 mg/dL       POCT Glucose - 02/21/17 1532      POCT Blood Glucose   Pre-Exercise 164 mg/dL   Post-Exercise 84 mg/dL  rechecked after 8 ounces of ginger ale and increased to 95         Exercise Prescription Changes - 02/21/17 1500      Response to Exercise   Blood Pressure (Admit) 122/56   Blood Pressure (Exercise) 144/52   Blood Pressure (Exit) 116/74   Heart Rate (Admit) 55 bpm   Heart Rate (Exercise) 70 bpm   Heart Rate (Exit) 59 bpm   Oxygen Saturation (Admit) 100 %   Oxygen Saturation (Exercise) 100 %   Oxygen Saturation (Exit) 99 %   Rating of Perceived Exertion (Exercise) 15   Perceived Dyspnea (Exercise) 2   Duration Progress to 45 minutes of aerobic exercise without  signs/symptoms of physical distress   Intensity THRR unchanged     Progression   Progression Continue to progress workloads to maintain intensity without signs/symptoms of physical distress.     Resistance Training   Training Prescription Yes   Weight orange bands   Reps 10-15   Time 10 Minutes     Interval Training   Interval Training No     Oxygen   Oxygen Continuous   Liters 2     NuStep   Level 4   Minutes 34   METs 2     Track   Laps 6   Minutes 17      History  Smoking Status  . Never Smoker  Smokeless Tobacco  . Never Used    Goals Met:  Exercise tolerated well Strength training completed today  Goals Unmet:  Not Applicable  Comments: Service time is from 1330 to 1510     Dr. Rush Farmer is Medical Director for Pulmonary Rehab at [facility].

## 2017-02-23 ENCOUNTER — Encounter (HOSPITAL_COMMUNITY)
Admission: RE | Admit: 2017-02-23 | Discharge: 2017-02-23 | Disposition: A | Discharge status: Home / Self Care | Payer: Medicare Other | Source: Ambulatory Visit | Attending: Cardiology | Admitting: Cardiology

## 2017-02-23 VITALS — Wt 164.7 lb

## 2017-02-23 DIAGNOSIS — I272 Pulmonary hypertension, unspecified: Secondary | ICD-10-CM | POA: Diagnosis not present

## 2017-02-23 NOTE — Progress Notes (Signed)
Incomplete Session Note  Patient Details  Name: Catherine Vazquez MRN: [id number] Date of Birth: 1924/09/29 Referring Provider:     Pulmonary Rehab Walk Test from 01/03/2017 in [facility]  Referring Provider  Dr. Loma Messing [name] did not complete her rehab session. Her pre-exercise CBG was 89. Per protocol it has to be greater than 100. She was given lemonade and discharged home without exercise related to time factor.

## 2017-02-28 ENCOUNTER — Encounter (HOSPITAL_COMMUNITY)
Admission: RE | Admit: 2017-02-28 | Discharge: 2017-02-28 | Disposition: A | Discharge status: Home / Self Care | Payer: Medicare Other | Source: Ambulatory Visit | Attending: Cardiology | Admitting: Cardiology

## 2017-02-28 VITALS — Wt 164.0 lb

## 2017-02-28 DIAGNOSIS — M1990 Unspecified osteoarthritis, unspecified site: Secondary | ICD-10-CM | POA: Diagnosis not present

## 2017-02-28 DIAGNOSIS — I110 Hypertensive heart disease with heart failure: Secondary | ICD-10-CM | POA: Diagnosis not present

## 2017-02-28 DIAGNOSIS — Z7982 Long term (current) use of aspirin: Secondary | ICD-10-CM | POA: Insufficient documentation

## 2017-02-28 DIAGNOSIS — E119 Type 2 diabetes mellitus without complications: Secondary | ICD-10-CM | POA: Diagnosis not present

## 2017-02-28 DIAGNOSIS — I272 Pulmonary hypertension, unspecified: Secondary | ICD-10-CM | POA: Insufficient documentation

## 2017-02-28 DIAGNOSIS — I509 Heart failure, unspecified: Secondary | ICD-10-CM | POA: Insufficient documentation

## 2017-02-28 DIAGNOSIS — Z79899 Other long term (current) drug therapy: Secondary | ICD-10-CM | POA: Diagnosis not present

## 2017-02-28 DIAGNOSIS — Z7984 Long term (current) use of oral hypoglycemic drugs: Secondary | ICD-10-CM | POA: Diagnosis not present

## 2017-02-28 NOTE — Progress Notes (Signed)
Pulmonary Individual Treatment Plan  Patient Details  Name: Catherine Vazquez MRN: [id number] Date of Birth: 09-30-1924 Referring Provider:     Pulmonary Rehab Walk Test from 01/03/2017 in [place]  Referring Provider  Dr. Einar Gip      Initial Encounter Date:    Pulmonary Rehab Walk Test from 01/03/2017 in [place]  Date  01/03/17  Referring Provider  Dr. Einar Gip      Visit Diagnosis: Pulmonary hypertension ([place])  Patient's Home Medications on Admission:   Current Outpatient Prescriptions:  .  amLODipine (NORVASC) 5 MG tablet, Take 5 mg by mouth daily., Disp: , Rfl:  .  aspirin EC 81 MG tablet, Take 81 mg by mouth daily., Disp: , Rfl:  .  carvedilol (COREG) 6.25 MG tablet, Take 6.25 mg by mouth 2 (two) times daily with a meal., Disp: , Rfl:  .  losartan (COZAAR) 100 MG tablet, Take 100 mg by mouth daily., Disp: , Rfl:  .  Misc Natural Products (OSTEO BI-FLEX/5-LOXIN ADVANCED PO), Take 1 tablet by mouth 2 (two) times daily. , Disp: , Rfl:  .  mometasone (NASONEX) 50 MCG/ACT nasal spray, Place 2 sprays into the nose daily., Disp: , Rfl:  .  Multiple Vitamins-Minerals (MULTIVITAMIN ADULTS) TABS, See admin instructions. Pack of vitamins:Take 1 pack by mouth once a day, Disp: , Rfl:  .  sitaGLIPtin (JANUVIA) 25 MG tablet, Take 25 mg by mouth daily., Disp: , Rfl:   Past Medical History: Past Medical History:  Diagnosis Date  . Arthritis   . CHF (congestive heart failure) ([place])   . Diabetes mellitus without complication ([place])   . Hypertension     Tobacco Use: History  Smoking Status  . Never Smoker  Smokeless Tobacco  . Never Used    Labs: Recent Review Flowsheet Data    There is no flowsheet data to display.      Capillary Blood Glucose: Lab Results  Component Value Date   GLUCAP 71 02/21/2017   GLUCAP 89 02/16/2017   GLUCAP 73 02/16/2017   GLUCAP 136 (H) 02/16/2017   GLUCAP 124 (H) 02/14/2017        POCT Glucose    Row Name 01/10/17 1508 01/26/17 1623 02/02/17 1616 02/07/17 1532 02/09/17 1539     POCT Blood Glucose   Pre-Exercise 105 mg/dL 137 mg/dL 138 mg/dL 104 mg/dL  Pt given bananna before exercise 143 mg/dL   Post-Exercise  - 128 mg/dL 118 mg/dL 126 mg/dL 115 mg/dL   Row Name 02/14/17 1512 02/21/17 1532           POCT Blood Glucose   Pre-Exercise 123 mg/dL 164 mg/dL      Post-Exercise 124 mg/dL 84 mg/dL  rechecked after 8 ounces of ginger ale and increased to 95         ADL UCSD:   Pulmonary Function Assessment:     Pulmonary Function Assessment - 12/30/16 1219      Breath   Bilateral Breath Sounds Clear   Shortness of Breath Yes;Limiting activity      Exercise Target Goals:    Exercise Program Goal: Individual exercise prescription set with THRR, safety & activity barriers. Participant demonstrates ability to understand and report RPE using BORG scale, to self-measure pulse accurately, and to acknowledge the importance of the exercise prescription.  Exercise Prescription Goal: Starting with aerobic activity 30 plus minutes a day, 3 days per week for initial exercise prescription. Provide home exercise prescription and  guidelines that participant acknowledges understanding prior to discharge.  Activity Barriers & Risk Stratification:   6 Minute Walk:     6 Minute Walk    Row Name 01/03/17 1636         6 Minute Walk   Phase Initial     Distance 850 feet     Walk Time 6 minutes     # of Rest Breaks 0     MPH 1.6     METS 2.23     RPE 12     Perceived Dyspnea  1     Symptoms Yes (comment)     Comments all over fatigue     Resting HR 70 bpm     Resting BP 150/70     Max Ex. HR 110 bpm     Max Ex. BP 185/72     2 Minute Post BP 160/83       Interval HR   Baseline HR 70     1 Minute HR 82     2 Minute HR 82     3 Minute HR 110     4 Minute HR 110     5 Minute HR 86     6 Minute HR 86     Interval Heart Rate? Yes       Interval  Oxygen   Interval Oxygen? Yes     Baseline Oxygen Saturation % 100 %     Baseline Liters of Oxygen 2 L     1 Minute Oxygen Saturation % 99 %     1 Minute Liters of Oxygen 2 L     2 Minute Oxygen Saturation % 100 %     2 Minute Liters of Oxygen 2 L     3 Minute Oxygen Saturation % 93 %     3 Minute Liters of Oxygen 2 L     4 Minute Oxygen Saturation % 92 %     4 Minute Liters of Oxygen 2 L     5 Minute Oxygen Saturation % 90 %     5 Minute Liters of Oxygen 2 L     6 Minute Oxygen Saturation % 90 %     6 Minute Liters of Oxygen 2 L        Oxygen Initial Assessment:     Oxygen Initial Assessment - 01/03/17 1635      Home Oxygen   Home Oxygen Device E-Tanks;Home Concentrator   Sleep Oxygen Prescription Continuous   Liters per minute 2   Home Exercise Oxygen Prescription Continuous   Liters per minute 2   Home at Rest Exercise Oxygen Prescription Continuous   Liters per minute 2   Compliance with Home Oxygen Use Yes     Initial 6 min Walk   Oxygen Used Continuous;E-Tanks   Liters per minute 2   Resting Oxygen Saturation  during 6 min walk 100 %   Exercise Oxygen Saturation  during 6 min walk 90 %     Program Oxygen Prescription   Program Oxygen Prescription Continuous;E-Tanks   Liters per minute 2     Intervention   Short Term Goals To learn and exhibit compliance with exercise, home and travel O2 prescription      Oxygen Re-Evaluation:     Oxygen Re-Evaluation    Row Name 01/31/17 0905 02/28/17 0933           Program Oxygen Prescription   Program Oxygen Prescription Continuous;E-Tanks Continuous;E-Tanks  Liters per minute 2 2        Home Oxygen   Home Oxygen Device Home Concentrator;E-Tanks Home Concentrator;E-Tanks      Sleep Oxygen Prescription Continuous Continuous      Liters per minute 2 2      Home Exercise Oxygen Prescription Continuous Continuous      Liters per minute 2 2      Home at Rest Exercise Oxygen Prescription Continuous Continuous       Liters per minute 2 2      Compliance with Home Oxygen Use Yes Yes        Goals/Expected Outcomes   Short Term Goals To learn and exhibit compliance with exercise, home and travel O2 prescription To learn and exhibit compliance with exercise, home and travel O2 prescription      Long  Term Goals Exhibits compliance with exercise, home and travel O2 prescription Exhibits compliance with exercise, home and travel O2 prescription      Comments is compliant with home oxygen continues to be compliant with oxygen prescription      Goals/Expected Outcomes continued compliance continued compliance         Oxygen Discharge (Final Oxygen Re-Evaluation):     Oxygen Re-Evaluation - 02/28/17 0933      Program Oxygen Prescription   Program Oxygen Prescription Continuous;E-Tanks   Liters per minute 2     Home Oxygen   Home Oxygen Device Home Concentrator;E-Tanks   Sleep Oxygen Prescription Continuous   Liters per minute 2   Home Exercise Oxygen Prescription Continuous   Liters per minute 2   Home at Rest Exercise Oxygen Prescription Continuous   Liters per minute 2   Compliance with Home Oxygen Use Yes     Goals/Expected Outcomes   Short Term Goals To learn and exhibit compliance with exercise, home and travel O2 prescription   Long  Term Goals Exhibits compliance with exercise, home and travel O2 prescription   Comments continues to be compliant with oxygen prescription   Goals/Expected Outcomes continued compliance      Initial Exercise Prescription:     Initial Exercise Prescription - 01/03/17 1600      Date of Initial Exercise RX and Referring Provider   Date 01/03/17   Referring Provider Dr. Einar Gip     Oxygen   Oxygen Continuous   Liters 2     NuStep   Level 2   Minutes 17   METs 1.5     Arm Ergometer   Level 2   Minutes 17     Track   Laps 5   Minutes 17     Prescription Details   Frequency (times per week) 2   Duration Progress to 45 minutes of aerobic  exercise without signs/symptoms of physical distress     Intensity   THRR 40-80% of Max Heartrate 52-105   Ratings of Perceived Exertion 11-13   Perceived Dyspnea 0-4     Progression   Progression Continue progressive overload as per policy without signs/symptoms or physical distress.     Resistance Training   Training Prescription Yes   Weight orange bands   Reps 10-15      Perform Capillary Blood Glucose checks as needed.  Exercise Prescription Changes:     Exercise Prescription Changes    Row Name 01/10/17 1500 01/12/17 1600 01/17/17 1553 01/19/17 1500 01/24/17 1500     Response to Exercise   Blood Pressure (Admit) 124/60 138/60 140/50 122/60 128/50   Blood  Pressure (Exercise) 136/70 120/64 142/60 138/68 152/64   Blood Pressure (Exit) 108/54 116/60 112/58 132/60 130/60   Heart Rate (Admit) 55 bpm 69 bpm 62 bpm 67 bpm 61 bpm   Heart Rate (Exercise) 112 bpm 65 bpm 69 bpm 70 bpm 73 bpm   Heart Rate (Exit) 60 bpm 60 bpm 66 bpm 69 bpm 58 bpm   Oxygen Saturation (Admit) 97 % 100 % 100 % 98 % 92 %   Oxygen Saturation (Exercise) 90 % 100 % 97 % 100 % 99 %   Oxygen Saturation (Exit) 100 % 100 % 100 % 98 % 100 %   Rating of Perceived Exertion (Exercise) _0 Perceived Dyspnea (Exercise) _1 Duration Progress to 45 minutes of aerobic exercise without signs/symptoms of physical distress Progress to 45 minutes of aerobic exercise without signs/symptoms of physical distress Progress to 45 minutes of aerobic exercise without signs/symptoms of physical distress Progress to 45 minutes of aerobic exercise without signs/symptoms of physical distress Progress to 45 minutes of aerobic exercise without signs/symptoms of physical distress   Intensity _2      Resistance Training   Training Prescription _3    Weight _4    Reps  10-15 10-15 10-15 10-15 10-15   Time 10 Minutes 10 Minutes 10 Minutes 10 Minutes 10 Minutes     Interval Training   Interval Training  - No No No No     Oxygen   Oxygen _5    Liters _6 NuStep   Level _7 Minutes _8 METs 1.8 1.7 1.6 1.7 1.9     Arm Ergometer   Level 2  - 2  - 2   Minutes 17  - 17  - 17     Track   Laps _9 Minutes _10 Row Name 01/26/17 1600 02/02/17 1600 02/07/17 1500 02/09/17 1500 02/14/17 1500     Response to Exercise   Blood Pressure (Admit) 126/40 130/54 136/60 132/50 124/60   Blood Pressure (Exercise) 128/70 136/80 120/52 124/50 130/70   Blood Pressure (Exit) 130/42 118/60 122/64 126/60 110/56   Heart Rate (Admit) 64 bpm 55 bpm 63 bpm 67 bpm 62 bpm   Heart Rate (Exercise) 71 bpm 65 bpm 71 bpm 79 bpm 59 bpm   Heart Rate (Exit) 61 bpm 69 bpm 64 bpm 69 bpm 63 bpm   Oxygen Saturation (Admit) 98 % 98 % 100 % 97 % 100 %   Oxygen Saturation (Exercise) 98 % 98 % 100 % 99 % 98 %   Oxygen Saturation (Exit) 98 % 100 % 100 % 99 % 93 %   Rating of Perceived Exertion (Exercise) _11 Perceived Dyspnea (Exercise) _12 Duration Progress to 45 minutes of aerobic exercise without signs/symptoms of physical distress Progress to 45 minutes of aerobic exercise without signs/symptoms of physical distress Progress to 45 minutes of aerobic exercise without signs/symptoms of physical distress Progress to 45 minutes of aerobic exercise without signs/symptoms of physical distress Progress to 45 minutes of aerobic exercise without signs/symptoms of physical distress   Intensity THRR unchanged THRR  unchanged THRR unchanged THRR unchanged THRR unchanged     Progression   Progression Continue to progress workloads to maintain intensity without signs/symptoms of physical distress. Continue to progress workloads to maintain intensity without signs/symptoms of physical  distress. Continue to progress workloads to maintain intensity without signs/symptoms of physical distress. Continue to progress workloads to maintain intensity without signs/symptoms of physical distress. Continue to progress workloads to maintain intensity without signs/symptoms of physical distress.     Resistance Training   Training Prescription _0    Weight _1    Reps 10-15 10-15 10-15 10-15 10-15   Time 10 Minutes 10 Minutes 10 Minutes 10 Minutes 10 Minutes     Interval Training   Interval Training _2      Oxygen   Oxygen _3    Liters _4 NuStep   Level 4  - _5 Minutes 17  - _6 METs 2  - 1.5 1.8 2.1     Arm Ergometer   Level  - 4 4  - 4   Minutes  - 17 17  - 17     Track   Laps _7 Minutes _8 Home Exercise Plan   Plans to continue exercise at  -  - Home (comment)  -  -   Frequency  -  - Add 3 additional days to program exercise sessions.  -  -   Row Name 02/16/17 1600 02/21/17 1500 02/23/17 1624         Response to Exercise   Blood Pressure (Admit) 114/52 122/56 120/62     Blood Pressure (Exercise) 162/62 144/52  -     Blood Pressure (Exit) 140/70 116/74  -     Heart Rate (Admit) 53 bpm 55 bpm  -     Heart Rate (Exercise) 69 bpm 70 bpm 70 bpm     Heart Rate (Exit) 59 bpm 59 bpm  -     Oxygen Saturation (Admit) 100 % 100 %  -     Oxygen Saturation (Exercise) 98 % 100 % 100 %     Oxygen Saturation (Exit) 100 % 99 %  -     Rating of Perceived Exertion (Exercise) 9 15  -     Perceived Dyspnea (Exercise) 1 2  -     Duration Progress to 45 minutes of aerobic exercise without signs/symptoms of physical distress Progress to 45 minutes of aerobic exercise without signs/symptoms of physical distress Progress to 45 minutes of aerobic exercise without signs/symptoms of physical distress      Intensity THRR unchanged THRR unchanged THRR unchanged       Progression   Progression Continue to progress workloads to maintain intensity without signs/symptoms of physical distress. Continue to progress workloads to maintain intensity without signs/symptoms of physical distress. Continue to progress workloads to maintain intensity without signs/symptoms of physical distress.       Resistance Training   Training Prescription Yes Yes  -     Weight orange bands orange bands  -     Reps 10-15 10-15  -     Time 10 Minutes 10 Minutes  -       Interval Training   Interval Training No No  -  Oxygen   Oxygen Continuous Continuous Continuous     Liters _0 NuStep   Level 4 4  -     Minutes 17 34  -     METs  - 2  -       Track   Laps 7 6  -     Minutes 17 17  -        Exercise Comments:     Exercise Comments    Row Name 02/07/17 1630           Exercise Comments Home exercise completed          Exercise Goals and Review:     Exercise Goals    Row Name 12/30/16 1040             Exercise Goals   Increase Physical Activity Yes       Intervention Provide advice, education, support and counseling about physical activity/exercise needs.;Develop an individualized exercise prescription for aerobic and resistive training based on initial evaluation findings, risk stratification, comorbidities and participant's personal goals.       Expected Outcomes Achievement of increased cardiorespiratory fitness and enhanced flexibility, muscular endurance and strength shown through measurements of functional capacity and personal statement of participant.       Increase Strength and Stamina Yes       Intervention Provide advice, education, support and counseling about physical activity/exercise needs.;Develop an individualized exercise prescription for aerobic and resistive training based on initial evaluation findings, risk stratification, comorbidities and participant's  personal goals.       Expected Outcomes Achievement of increased cardiorespiratory fitness and enhanced flexibility, muscular endurance and strength shown through measurements of functional capacity and personal statement of participant.          Exercise Goals Re-Evaluation :     Exercise Goals Re-Evaluation    Row Name 01/30/17 1302 02/27/17 1150           Exercise Goal Re-Evaluation   Exercise Goals Review Increase Strenth and Stamina;Increase Physical Activity Increase Physical Activity;Increase Strenth and Stamina      Comments Patient is progressing low and slow in regards to workload intensities. Vitals are great, rate of perceived exertion levels are fairly light to somewhat hard. Will cont. to monitor patient and increase workloads as appropriate.  Patient is progressing low and slow in regards to workload intensities. MET levels average about 2.0. Vitals are great, rate of perceived exertion levels are fairly light to somewhat hard. Will cont. to monitor patient and increase workloads as appropriate.       Expected Outcomes Through exercising at rehab and at home, the patient will be able to increase strength and stamina which will make ADL's easier to preform. Through exercising at rehab and at home, patient will increase physical strength and stamina and find ADL's easier to perform.          Discharge Exercise Prescription (Final Exercise Prescription Changes):     Exercise Prescription Changes - 02/23/17 1624      Response to Exercise   Blood Pressure (Admit) 120/62   Heart Rate (Exercise) 70 bpm   Oxygen Saturation (Exercise) 100 %   Duration Progress to 45 minutes of aerobic exercise without signs/symptoms of physical distress   Intensity THRR unchanged     Progression   Progression Continue to progress workloads to maintain intensity without signs/symptoms of physical distress.     Oxygen   Oxygen Continuous  Liters 2      Nutrition:  Target Goals:  Understanding of nutrition guidelines, daily intake of sodium <1555m, cholesterol <2012m calories 30% from fat and 7% or less from saturated fats, daily to have 5 or more servings of fruits and vegetables.  Biometrics:     Pre Biometrics - 12/30/16 1040      Pre Biometrics   Grip Strength 15 kg       Nutrition Therapy Plan and Nutrition Goals:   Nutrition Discharge: Rate Your Plate Scores:   Nutrition Goals Re-Evaluation:   Nutrition Goals Discharge (Final Nutrition Goals Re-Evaluation):   Psychosocial: Target Goals: Acknowledge presence or absence of significant depression and/or stress, maximize coping skills, provide positive support system. Participant is able to verbalize types and ability to use techniques and skills needed for reducing stress and depression.  Initial Review & Psychosocial Screening:     Initial Psych Review & Screening - 12/30/16 1220      Initial Review   Current issues with Current [facility]  [facility]     Family Dynamics   Good Support System? Yes   Concerns Recent loss of significant other   Comments loss of child in 2006     Barriers   Psychosocial barriers to participate in program There are no identifiable barriers or psychosocial needs.     Screening Interventions   Interventions Encouraged to exercise      Quality of Life Scores:   PHQ-9: Recent Review Flowsheet Data    [facility]   Decreased Interest 1   Down, Depressed, Hopeless 1   PHQ - 2 Score 2   Altered sleeping 1   Tired, decreased energy 3   Change in appetite 2   Feeling bad or failure about yourself  0   Trouble concentrating 0   Moving slowly or fidgety/restless 0   Suicidal thoughts 0   PHQ-9 Score 8   Difficult doing work/chores Not difficult at all     Interpretation of Total Score  Total Score Depression Severity:  1-4 = Minimal depression, 5-9 = Mild depression, 10-14 = Moderate depression, 15-19 = Moderately severe  depression, 20-27 = Severe depression   Psychosocial Evaluation and Intervention:   Psychosocial Re-Evaluation:     Psychosocial Re-Evaluation    RoStearnsame 01/31/17 0908 02/28/17 [id number]         Psychosocial Re-Evaluation   Current issues with None Identified None Identified      Interventions Encouraged to attend Pulmonary Rehabilitation for the exercise Encouraged to attend Pulmonary Rehabilitation for the exercise      Continue Psychosocial Services  No Follow up required No Follow up required         Psychosocial Discharge (Final Psychosocial Re-Evaluation):     Psychosocial Re-Evaluation - 02/28/17 0937      Psychosocial Re-Evaluation   Current issues with None Identified   Interventions Encouraged to attend Pulmonary Rehabilitation for the exercise   Continue Psychosocial Services  No Follow up required      Education: Education Goals: Education classes will be provided on a weekly basis, covering required topics. Participant will state understanding/return demonstration of topics presented.  Learning Barriers/Preferences:     Learning Barriers/Preferences - 12/30/16 1126      Learning Barriers/Preferences   Learning Barriers None   Learning Preferences Computer/Internet;Group Instruction;Verbal Instruction;Written Material      Education Topics: Risk Factor Reduction:  -Group instruction that is supported by a PowerPoint presentation. Instructor discusses  the definition of a risk factor, different risk factors for pulmonary disease, and how the heart and lungs work together.     Nutrition for Pulmonary Patient:  -Group instruction provided by PowerPoint slides, verbal discussion, and written materials to support subject matter. The instructor gives an explanation and review of healthy diet recommendations, which includes a discussion on weight management, recommendations for fruit and vegetable consumption, as well as protein, fluid, caffeine, fiber, sodium,  sugar, and alcohol. Tips for eating when patients are short of breath are discussed.   Pursed Lip Breathing:  -Group instruction that is supported by demonstration and informational handouts. Instructor discusses the benefits of pursed lip and diaphragmatic breathing and detailed demonstration on how to preform both.     Oxygen Safety:  -Group instruction provided by PowerPoint, verbal discussion, and written material to support subject matter. There is an overview of "What is Oxygen" and "Why do we need it".  Instructor also reviews how to create a safe environment for oxygen use, the importance of using oxygen as prescribed, and the risks of noncompliance. There is a brief discussion on traveling with oxygen and resources the patient may utilize.   Oxygen Equipment:  -Group instruction provided by [facility] Staff utilizing handouts, written materials, and equipment demonstrations.   PULMONARY REHAB OTHER RESPIRATORY from 02/23/2017 in [place]  Date  02/16/17  Educator  Ace Gins  Instruction Review Code  2- meets goals/outcomes      Signs and Symptoms:  -Group instruction provided by written material and verbal discussion to support subject matter. Warning signs and symptoms of infection, stroke, and heart attack are reviewed and when to call the physician/911 reinforced. Tips for preventing the spread of infection discussed.   PULMONARY REHAB OTHER RESPIRATORY from 02/23/2017 in [place]  Date  01/26/17  Educator  RN  Instruction Review Code  2- meets goals/outcomes      Advanced Directives:  -Group instruction provided by verbal instruction and written material to support subject matter. Instructor reviews Advanced Directive laws and proper instruction for filling out document.   Pulmonary Video:  -Group video education that reviews the importance of medication and oxygen compliance, exercise, good nutrition,  pulmonary hygiene, and pursed lip and diaphragmatic breathing for the pulmonary patient.   Exercise for the Pulmonary Patient:  -Group instruction that is supported by a PowerPoint presentation. Instructor discusses benefits of exercise, core components of exercise, frequency, duration, and intensity of an exercise routine, importance of utilizing pulse oximetry during exercise, safety while exercising, and options of places to exercise outside of rehab.     PULMONARY REHAB OTHER RESPIRATORY from 02/23/2017 in [place]  Date  01/12/17  Educator  EP  Instruction Review Code  2- meets goals/outcomes      Pulmonary Medications:  -Verbally interactive group education provided by instructor with focus on inhaled medications and proper administration.   PULMONARY REHAB OTHER RESPIRATORY from 02/23/2017 in [place]  Date  02/09/17  Educator  Pharm  Instruction Review Code  2- meets goals/outcomes      Anatomy and Physiology of the Respiratory System and Intimacy:  -Group instruction provided by PowerPoint, verbal discussion, and written material to support subject matter. Instructor reviews respiratory cycle and anatomical components of the respiratory system and their functions. Instructor also reviews differences in obstructive and restrictive respiratory diseases with examples of each. Intimacy, Sex, and Sexuality differences are reviewed  with a discussion on how relationships can change when diagnosed with pulmonary disease. Common sexual concerns are reviewed.   PULMONARY REHAB OTHER RESPIRATORY from 02/23/2017 in [place]  Date  02/02/17  Educator  RN  Instruction Review Code  2- meets goals/outcomes      MD DAY -A group question and answer session with a medical doctor that allows participants to ask questions that relate to their pulmonary disease state.   OTHER EDUCATION -Group or  individual verbal, written, or video instructions that support the educational goals of the pulmonary rehab program.   Knowledge Questionnaire Score:   Core Components/Risk Factors/Patient Goals at Admission:     Personal Goals and Risk Factors at Admission - 12/30/16 1219      Core Components/Risk Factors/Patient Goals on Admission   Improve shortness of breath with ADL's Yes   Intervention Provide education, individualized exercise plan and daily activity instruction to help decrease symptoms of SOB with activities of daily living.   Expected Outcomes Short Term: Achieves a reduction of symptoms when performing activities of daily living.   Develop more efficient breathing techniques such as purse lipped breathing and diaphragmatic breathing; and practicing self-pacing with activity Yes   Intervention Provide education, demonstration and support about specific breathing techniuqes utilized for more efficient breathing. Include techniques such as pursed lipped breathing, diaphragmatic breathing and self-pacing activity.   Expected Outcomes Short Term: Participant will be able to demonstrate and use breathing techniques as needed throughout daily activities.   Hypertension Yes   Intervention Provide education on lifestyle modifcations including regular physical activity/exercise, weight management, moderate sodium restriction and increased consumption of fresh fruit, vegetables, and low fat dairy, alcohol moderation, and smoking cessation.;Monitor prescription use compliance.   Expected Outcomes Short Term: Continued assessment and intervention until BP is < 140/109m HG in hypertensive participants. < 130/832mHG in hypertensive participants with diabetes, heart failure or chronic kidney disease.;Long Term: Maintenance of blood pressure at goal levels.      Core Components/Risk Factors/Patient Goals Review:      Goals and Risk Factor Review    Row Name 01/31/17 0992336/05/18 0934            Core Components/Risk Factors/Patient Goals Review   Personal Goals Review Hypertension;Improve shortness of breath with ADL's;Develop more efficient breathing techniques such as purse lipped breathing and diaphragmatic breathing and practicing self-pacing with activity. Hypertension;Improve shortness of breath with ADL's;Develop more efficient breathing techniques such as purse lipped breathing and diaphragmatic breathing and practicing self-pacing with activity.      Review blood pressure is controlled, just started program, performing PLB, too early to see dramatic improvements BP continues to be controlled, progressing with workloads increases, regular attendance, SOB is improving, PLB demonstrated      Expected Outcomes Expect to see more improvement in goals in next 30 days continue to see improvement with progression of program         Core Components/Risk Factors/Patient Goals at Discharge (Final Review):      Goals and Risk Factor Review - 02/28/17 0934      Core Components/Risk Factors/Patient Goals Review   Personal Goals Review Hypertension;Improve shortness of breath with ADL's;Develop more efficient breathing techniques such as purse lipped breathing and diaphragmatic breathing and practicing self-pacing with activity.   Review BP continues to be controlled, progressing with workloads increases, regular attendance, SOB is improving, PLB demonstrated   Expected Outcomes continue to see improvement with progression of program  ITP Comments:   Comments: ITP REVIEW Pt is making expected progress toward pulmonary rehab goals after completing 13 sessions. Recommend continued exercise, life style modification, education, and utilization of breathing techniques to increase stamina and strength and decrease shortness of breath with exertion.

## 2017-02-28 NOTE — Progress Notes (Signed)
Daily Session Note  Patient Details  Name: Catherine Vazquez MRN: [id number] Date of Birth: July 14, 1925 Referring Provider:     Pulmonary Rehab Walk Test from 01/03/2017 in [place]  Referring Provider  Dr. Einar Gip      Encounter Date: 02/28/2017  Check In:     Session Check In - 02/28/17 1555      Check-In   Location [facility]   Staff Present Su Hilt, MS, ACSM RCEP, Exercise Physiologist;Lisa Ysidro Evert, RN   Supervising physician immediately available to respond to emergencies [facility] immediately available   Physician(s) Dr. Broadus John   Medication changes reported     No   Fall or balance concerns reported    No   Tobacco Cessation No Change   Warm-up and Cool-down Performed as group-led instruction   Resistance Training Performed Yes   VAD Patient? No     Pain Assessment   Currently in Pain? No/denies   Multiple Pain Sites No      Capillary Blood Glucose: No results found for this or any previous visit (from the past 24 hour(s)).     POCT Glucose - 02/28/17 1559      POCT Blood Glucose   Pre-Exercise 101 mg/dL   Post-Exercise 135 mg/dL         Exercise Prescription Changes - 02/28/17 1555      Response to Exercise   Blood Pressure (Admit) 134/54   Blood Pressure (Exercise) 144/64   Blood Pressure (Exit) 130/60   Heart Rate (Admit) 63 bpm   Heart Rate (Exercise) 96 bpm   Heart Rate (Exit) 75 bpm   Oxygen Saturation (Admit) 100 %   Oxygen Saturation (Exercise) 95 %   Oxygen Saturation (Exit) 99 %   Rating of Perceived Exertion (Exercise) 13   Perceived Dyspnea (Exercise) 1   Duration Progress to 45 minutes of aerobic exercise without signs/symptoms of physical distress   Intensity THRR unchanged     Progression   Progression Continue to progress workloads to maintain intensity without signs/symptoms of physical distress.     Resistance Training   Training Prescription Yes   Weight orange  bands   Reps 10-15   Time 10 Minutes     Interval Training   Interval Training No     Oxygen   Oxygen Continuous   Liters 2     NuStep   Level 5   Minutes 15   METs 1.8     Arm Ergometer   Level 3   Minutes 17     Track   Laps 6   Minutes 17      History  Smoking Status  . Never Smoker  Smokeless Tobacco  . Never Used    Goals Met:  Exercise tolerated well Queuing for purse lip breathing No report of cardiac concerns or symptoms Strength training completed today  Goals Unmet:  Not Applicable  Comments: Service time is from 1330 to 1500   Dr. Rush Farmer is Medical Director for Pulmonary Rehab at [facility].

## 2017-03-02 ENCOUNTER — Encounter (HOSPITAL_COMMUNITY)
Admission: RE | Admit: 2017-03-02 | Discharge: 2017-03-02 | Disposition: A | Discharge status: Home / Self Care | Payer: Medicare Other | Source: Ambulatory Visit | Attending: Cardiology | Admitting: Cardiology

## 2017-03-02 DIAGNOSIS — I272 Pulmonary hypertension, unspecified: Secondary | ICD-10-CM

## 2017-03-02 NOTE — Progress Notes (Signed)
Catherine Vazquez came to exercise in pulmonary rehab with a CBG of 79, she was given 8 ounces of ginger ale and recheck 15 minutes later was 98.  She was not allowed to exercise and was instructed to not take her Januvia on days she exercises in pulmonary rehab.

## 2017-03-02 NOTE — Progress Notes (Signed)
Catherine Vazquez 83 y.o. female Nutrition Note Spoke with pt. Pt eats 2 meals plus mulitple snacks during the day. Pt is diabetic.  Pt checks CBG's once daily.  Fasting CBG's reportedly 86-106 mg/dL before meals.  Januvia recently resumed. Since Januvia restarted, pt has had difficulty with CBG's being adequate for exercise. Pre-exercise CBG 79 mg/dL today. Pt states she took her Januvia at 8 am and ate 2 hard boiled eggs, 3 Nutrigrain blueberry waffles with honey, an orange and an apple. Pt ate some graham crackers when she first arrived at Pulmonary Rehab. Pt unable to recall last A1c. Pt states Leilani Able, MD manages her DM. Pt expressed understanding of the information reviewed via feedback method.    No results found for: HGBA1C  Nutrition Diagnosis ? Food-and nutrition-related knowledge deficit related to lack of exposure to information as related to diagnosis of pulmonary disease  Nutrition Intervention ? Pt's individual nutrition plan and goals reviewed with pt. ? Pt to attend the Nutrition and Lung Disease class ? Pt to hold Januvia on exercise days to see if pre-exercise CBG's improve. ? Will contact pt's PCP re: ? Recheck pt's A1c to evaluate if pt needs to be on Januvia.  ? Continual client-centered nutrition education by RD, as part of interdisciplinary care.  Goal(s) 1. CBG's in the normal range or as close to normal as is safely possible.  Monitor and Evaluate progress toward nutrition goal with team.   Mickle Plumb, M.Ed, RD, LDN, CDE 03/02/2017 1:59 PM

## 2017-03-07 ENCOUNTER — Encounter (HOSPITAL_COMMUNITY)
Admission: RE | Admit: 2017-03-07 | Discharge: 2017-03-07 | Disposition: A | Discharge status: Home / Self Care | Payer: Medicare Other | Source: Ambulatory Visit | Attending: Cardiology | Admitting: Cardiology

## 2017-03-07 DIAGNOSIS — I272 Pulmonary hypertension, unspecified: Secondary | ICD-10-CM | POA: Diagnosis not present

## 2017-03-07 NOTE — Progress Notes (Signed)
Catherine Vazquez did not exercise today because her CBG was 94 after holding her Januvia this morning. She drops her CBG 30-40 points with exercise normally.  I called Dr. Pecola Leisure and she has an appointment with Dr. Pecola Leisure tomorrow, March 08, 2017 @ 3:15pm to discuss her diabetic medications.

## 2017-03-08 LAB — GLUCOSE, CAPILLARY: Glucose-Capillary: 94 mg/dL (ref 65–99)

## 2017-03-09 ENCOUNTER — Encounter (HOSPITAL_COMMUNITY)
Admission: RE | Admit: 2017-03-09 | Discharge: 2017-03-09 | Disposition: A | Discharge status: Home / Self Care | Payer: Medicare Other | Source: Ambulatory Visit | Attending: Cardiology | Admitting: Cardiology

## 2017-03-09 VITALS — Wt 164.0 lb

## 2017-03-09 DIAGNOSIS — I272 Pulmonary hypertension, unspecified: Secondary | ICD-10-CM | POA: Diagnosis not present

## 2017-03-09 NOTE — Progress Notes (Signed)
Daily Session Note  Patient Details  Name: Catherine Vazquez MRN: [id number] Date of Birth: 29-Dec-1924 Referring Provider:     Pulmonary Rehab Walk Test from 01/03/2017 in [place]  Referring Provider  Dr. Einar Gip      Encounter Date: 03/09/2017  Check In:     Session Check In - 03/09/17 1338      Check-In   Location [facility]   Staff Present Su Hilt, MS, ACSM RCEP, Exercise Physiologist;Portia Rollene Rotunda, RN, BSN   Supervising physician immediately available to respond to emergencies [facility] immediately available   Physician(s) Dr. Sloan Leiter   Medication changes reported     No   Fall or balance concerns reported    No   Tobacco Cessation No Change   Warm-up and Cool-down Performed as group-led instruction   Resistance Training Performed Yes   VAD Patient? No     Pain Assessment   Currently in Pain? No/denies   Multiple Pain Sites No      Capillary Blood Glucose: No results found for this or any previous visit (from the past 24 hour(s)).      Exercise Prescription Changes - 03/09/17 1600      Response to Exercise   Blood Pressure (Admit) 128/52   Blood Pressure (Exercise) 128/60   Blood Pressure (Exit) 124/58   Heart Rate (Admit) 63 bpm   Heart Rate (Exercise) 82 bpm   Heart Rate (Exit) 67 bpm   Oxygen Saturation (Admit) 100 %   Oxygen Saturation (Exercise) 98 %   Oxygen Saturation (Exit) 97 %   Rating of Perceived Exertion (Exercise) 13   Perceived Dyspnea (Exercise) 3   Duration Progress to 45 minutes of aerobic exercise without signs/symptoms of physical distress   Intensity THRR unchanged     Progression   Progression Continue to progress workloads to maintain intensity without signs/symptoms of physical distress.     Resistance Training   Training Prescription Yes   Weight orange bands   Reps 10-15   Time 10 Minutes     Interval Training   Interval Training No     Oxygen   Oxygen  Continuous   Liters 2     Arm Ergometer   Level 3   Minutes 17     Track   Laps 6   Minutes 17      History  Smoking Status  . Never Smoker  Smokeless Tobacco  . Never Used    Goals Met:  Exercise tolerated well No report of cardiac concerns or symptoms Strength training completed today  Goals Unmet:  Not Applicable  Comments: Service time is from 1:30p to 3:30p    Dr. Rush Farmer is Medical Director for Pulmonary Rehab at [facility].

## 2017-03-14 ENCOUNTER — Encounter (HOSPITAL_COMMUNITY)
Admission: RE | Admit: 2017-03-14 | Discharge: 2017-03-14 | Disposition: A | Discharge status: Home / Self Care | Payer: Medicare Other | Source: Ambulatory Visit | Attending: Cardiology | Admitting: Cardiology

## 2017-03-14 VITALS — Wt 163.1 lb

## 2017-03-14 DIAGNOSIS — I272 Pulmonary hypertension, unspecified: Secondary | ICD-10-CM | POA: Diagnosis not present

## 2017-03-14 NOTE — Progress Notes (Signed)
Daily Session Note  Patient Details  Name: Catherine Vazquez MRN: [id number] Date of Birth: [birth year] Referring Provider:     Pulmonary Rehab Walk Test from 01/03/2017 in [place]  Referring Provider  Dr. Einar Gip      Encounter Date: 03/14/2017  Check In:     Session Check In - 03/14/17 1330      Check-In   Location [facility]   Staff Present Rosebud Poles, RN, BSN;Molly diVincenzo, MS, ACSM RCEP, Exercise Physiologist;Portia Rollene Rotunda, RN, BSN   Supervising physician immediately available to respond to emergencies [facility] immediately available   Physician(s) Dr. Allyson Sabal   Medication changes reported     No   Fall or balance concerns reported    No   Tobacco Cessation No Change   Warm-up and Cool-down Performed as group-led instruction   Resistance Training Performed Yes   VAD Patient? No     Pain Assessment   Currently in Pain? No/denies   Multiple Pain Sites No      Capillary Blood Glucose: No results found for this or any previous visit (from the past 24 hour(s)).      Exercise Prescription Changes - 03/14/17 1500      Response to Exercise   Blood Pressure (Admit) 140/54   Blood Pressure (Exercise) 140/70   Blood Pressure (Exit) 110/60   Heart Rate (Admit) 62 bpm   Heart Rate (Exercise) 81 bpm   Heart Rate (Exit) 60 bpm   Oxygen Saturation (Admit) 100 %   Oxygen Saturation (Exercise) 94 %   Oxygen Saturation (Exit) 99 %   Rating of Perceived Exertion (Exercise) 12   Perceived Dyspnea (Exercise) 1   Duration Progress to 45 minutes of aerobic exercise without signs/symptoms of physical distress   Intensity THRR unchanged     Progression   Progression Continue to progress workloads to maintain intensity without signs/symptoms of physical distress.     Resistance Training   Training Prescription Yes   Weight orange bands   Reps 10-15   Time 10 Minutes     Interval Training   Interval Training No      Oxygen   Oxygen Continuous   Liters 2     NuStep   Level 5   Minutes 15   METs 1.7     Arm Ergometer   Level 4   Minutes 17     Track   Laps 5   Minutes 17      History  Smoking Status  . Never Smoker  Smokeless Tobacco  . Never Used    Goals Met:  Exercise tolerated well Strength training completed today  Goals Unmet:  Not Applicable  Comments: Service time is from 1330 to 1500    Dr. Rush Farmer is Medical Director for Pulmonary Rehab at [facility].

## 2017-03-16 ENCOUNTER — Encounter (HOSPITAL_COMMUNITY)
Admission: RE | Admit: 2017-03-16 | Discharge: 2017-03-16 | Disposition: A | Discharge status: Home / Self Care | Payer: Medicare Other | Source: Ambulatory Visit | Attending: Cardiology | Admitting: Cardiology

## 2017-03-16 VITALS — Wt 163.4 lb

## 2017-03-16 DIAGNOSIS — I272 Pulmonary hypertension, unspecified: Secondary | ICD-10-CM | POA: Diagnosis not present

## 2017-03-16 NOTE — Progress Notes (Signed)
Daily Session Note  Patient Details  Name: MYLINDA BROOK MRN: [id number] Date of Birth: 25-Feb-1925 Referring Provider:     Pulmonary Rehab Walk Test from 01/03/2017 in [place]  Referring Provider  Dr. Einar Gip      Encounter Date: 03/16/2017  Check In:     Session Check In - 03/16/17 1351      Check-In   Location [facility]   Staff Present Su Hilt, MS, ACSM RCEP, Exercise Physiologist;Joan Leonia Reeves, RN, Luisa Hart, RN, BSN   Supervising physician immediately available to respond to emergencies [facility] immediately available   Physician(s) Dr. Allyson Sabal   Medication changes reported     No   Fall or balance concerns reported    No   Tobacco Cessation No Change   Warm-up and Cool-down Performed as group-led instruction   Resistance Training Performed Yes   VAD Patient? No     Pain Assessment   Currently in Pain? No/denies   Multiple Pain Sites No      Capillary Blood Glucose: No results found for this or any previous visit (from the past 24 hour(s)).      Exercise Prescription Changes - 03/16/17 1554      Response to Exercise   Blood Pressure (Admit) 120/64   Blood Pressure (Exercise) 130/64   Blood Pressure (Exit) 136/66   Heart Rate (Admit) 68 bpm   Heart Rate (Exercise) 63 bpm   Heart Rate (Exit) 65 bpm   Oxygen Saturation (Admit) 91 %   Oxygen Saturation (Exercise) 100 %   Oxygen Saturation (Exit) 96 %   Rating of Perceived Exertion (Exercise) 13   Perceived Dyspnea (Exercise) 1   Duration Progress to 45 minutes of aerobic exercise without signs/symptoms of physical distress   Intensity THRR unchanged     Progression   Progression Continue to progress workloads to maintain intensity without signs/symptoms of physical distress.     Resistance Training   Training Prescription Yes   Weight orange bands   Reps 10-15   Time 10 Minutes     Interval Training   Interval Training No      Oxygen   Oxygen Continuous   Liters 2     NuStep   Level 5   Minutes 15   METs 1.8     Arm Ergometer   Level 4   Minutes 17      History  Smoking Status  . Never Smoker  Smokeless Tobacco  . Never Used    Goals Met:  Using PLB without cueing & demonstrates good technique Exercise tolerated well No report of cardiac concerns or symptoms Strength training completed today  Goals Unmet:  Not Applicable  Comments: Service time is from 1330 to 1520   Dr. Rush Farmer is Medical Director for Pulmonary Rehab at [facility].

## 2017-03-21 ENCOUNTER — Encounter (HOSPITAL_COMMUNITY)
Admission: RE | Admit: 2017-03-21 | Discharge: 2017-03-21 | Disposition: A | Discharge status: Home / Self Care | Payer: Medicare Other | Source: Ambulatory Visit | Attending: Cardiology | Admitting: Cardiology

## 2017-03-21 VITALS — Wt 165.1 lb

## 2017-03-21 DIAGNOSIS — I272 Pulmonary hypertension, unspecified: Secondary | ICD-10-CM

## 2017-03-21 NOTE — Progress Notes (Signed)
Daily Session Note  Patient Details  Name: Catherine Vazquez MRN: [id number] Date of Birth: Jan 22, 1925 Referring Provider:     Pulmonary Rehab Walk Test from 01/03/2017 in [place]  Referring Provider  Dr. Einar Gip      Encounter Date: 03/21/2017  Check In:     Session Check In - 03/21/17 1329      Check-In   Location [facility]   Staff Present Rosebud Poles, RN, BSN;Molly diVincenzo, MS, ACSM RCEP, Exercise Physiologist;Marlee Armenteros Ysidro Evert, RN   Supervising physician immediately available to respond to emergencies [facility] immediately available   Physician(s) Dr. Allyson Sabal   Medication changes reported     No   Fall or balance concerns reported    No   Tobacco Cessation No Change   Warm-up and Cool-down Performed as group-led instruction   Resistance Training Performed Yes   VAD Patient? No     Pain Assessment   Currently in Pain? No/denies   Multiple Pain Sites No      Capillary Blood Glucose: No results found for this or any previous visit (from the past 24 hour(s)).      Exercise Prescription Changes - 03/21/17 1500      Response to Exercise   Blood Pressure (Admit) 140/60   Blood Pressure (Exercise) 120/50   Blood Pressure (Exit) 122/70   Heart Rate (Admit) 64 bpm   Heart Rate (Exercise) 63 bpm   Heart Rate (Exit) 67 bpm   Oxygen Saturation (Admit) 99 %   Oxygen Saturation (Exercise) 100 %   Oxygen Saturation (Exit) 98 %   Rating of Perceived Exertion (Exercise) 15   Perceived Dyspnea (Exercise) 3   Duration Progress to 45 minutes of aerobic exercise without signs/symptoms of physical distress   Intensity THRR unchanged     Progression   Progression Continue to progress workloads to maintain intensity without signs/symptoms of physical distress.     Resistance Training   Training Prescription Yes   Weight orange bands   Reps 10-15   Time 10 Minutes     Interval Training   Interval Training No     Oxygen   Oxygen Continuous   Liters 2     NuStep   Level 5   Minutes 17   METs 1.8     Arm Ergometer   Level 4   Minutes 17     Track   Laps 6   Minutes 17      History  Smoking Status  . Never Smoker  Smokeless Tobacco  . Never Used    Goals Met:  No report of cardiac concerns or symptoms Strength training completed today  Goals Unmet:  Not Applicable  Comments: Service time is from 1330 to 1500    Dr. Rush Farmer is Medical Director for Pulmonary Rehab at [facility].

## 2017-03-23 ENCOUNTER — Encounter (HOSPITAL_COMMUNITY)
Admission: RE | Admit: 2017-03-23 | Discharge: 2017-03-23 | Disposition: A | Discharge status: Home / Self Care | Payer: Medicare Other | Source: Ambulatory Visit | Attending: Cardiology | Admitting: Cardiology

## 2017-03-23 VITALS — Wt 164.5 lb

## 2017-03-23 DIAGNOSIS — I272 Pulmonary hypertension, unspecified: Secondary | ICD-10-CM | POA: Diagnosis not present

## 2017-03-23 NOTE — Progress Notes (Signed)
Daily Session Note  Patient Details  Name: Catherine Vazquez MRN: [id number] Date of Birth: [birth year] Referring Provider:     Pulmonary Rehab Walk Test from 01/03/2017 in [place]  Referring Provider  Dr. Einar Gip      Encounter Date: 03/23/2017  Check In:     Session Check In - 03/23/17 1357      Check-In   Location [facility]   Staff Present Su Hilt, MS, ACSM RCEP, Exercise Physiologist;Adana Marik Colletta [place], RN, [place] physician immediately available to respond to emergencies [facility] immediately available   Physician(s) Dr. Allyson Sabal   Medication changes reported     No   Fall or balance concerns reported    No   Tobacco Cessation No Change   Warm-up and Cool-down Performed as group-led instruction   Resistance Training Performed Yes   VAD Patient? No     Pain Assessment   Currently in Pain? No/denies   Multiple Pain Sites No      Capillary Blood Glucose: No results found for this or any previous visit (from the past 24 hour(s)).      Exercise Prescription Changes - 03/23/17 1500      Response to Exercise   Blood Pressure (Admit) 148/64   Blood Pressure (Exercise) 160/70   Blood Pressure (Exit) 118/70   Heart Rate (Admit) 66 bpm   Heart Rate (Exercise) 63 bpm   Heart Rate (Exit) 58 bpm   Oxygen Saturation (Admit) 100 %   Oxygen Saturation (Exercise) 100 %   Oxygen Saturation (Exit) 100 %   Rating of Perceived Exertion (Exercise) 13   Perceived Dyspnea (Exercise) 3   Duration Progress to 45 minutes of aerobic exercise without signs/symptoms of physical distress   Intensity THRR unchanged     Progression   Progression Continue to progress workloads to maintain intensity without signs/symptoms of physical distress.     Resistance Training   Training Prescription Yes   Weight orange bands   Reps 10-15   Time 10 Minutes     Interval Training   Interval Training No      Oxygen   Oxygen Continuous   Liters 2     Arm Ergometer   Level 4   Minutes 17     Track   Laps 7   Minutes 17      History  Smoking Status  . Never Smoker  Smokeless Tobacco  . Never Used    Goals Met:  Exercise tolerated well No report of cardiac concerns or symptoms Strength training completed today  Goals Unmet:  Not Applicable  Comments: Service time is from 1330 to 1505    Dr. Rush Farmer is Medical Director for Pulmonary Rehab at [facility].

## 2017-03-28 ENCOUNTER — Encounter (HOSPITAL_COMMUNITY)
Admission: RE | Admit: 2017-03-28 | Discharge: 2017-03-28 | Disposition: A | Discharge status: Home / Self Care | Payer: Medicare Other | Source: Ambulatory Visit | Attending: Cardiology | Admitting: Cardiology

## 2017-03-28 DIAGNOSIS — I509 Heart failure, unspecified: Secondary | ICD-10-CM | POA: Insufficient documentation

## 2017-03-28 DIAGNOSIS — E119 Type 2 diabetes mellitus without complications: Secondary | ICD-10-CM | POA: Insufficient documentation

## 2017-03-28 DIAGNOSIS — Z7982 Long term (current) use of aspirin: Secondary | ICD-10-CM | POA: Insufficient documentation

## 2017-03-28 DIAGNOSIS — Z79899 Other long term (current) drug therapy: Secondary | ICD-10-CM | POA: Insufficient documentation

## 2017-03-28 DIAGNOSIS — I272 Pulmonary hypertension, unspecified: Secondary | ICD-10-CM | POA: Insufficient documentation

## 2017-03-28 DIAGNOSIS — M1990 Unspecified osteoarthritis, unspecified site: Secondary | ICD-10-CM | POA: Insufficient documentation

## 2017-03-28 DIAGNOSIS — Z7984 Long term (current) use of oral hypoglycemic drugs: Secondary | ICD-10-CM | POA: Insufficient documentation

## 2017-03-28 DIAGNOSIS — I110 Hypertensive heart disease with heart failure: Secondary | ICD-10-CM | POA: Insufficient documentation

## 2017-03-28 NOTE — Progress Notes (Signed)
Pulmonary Individual Treatment Plan  Patient Details  Name: Catherine Vazquez MRN: [id number] Date of Birth: September 16, 1925 Referring Provider:     Pulmonary Rehab Walk Test from 01/03/2017 in [facility]  Referring Provider  Dr. Einar Gip      Initial Encounter Date:    Pulmonary Rehab Walk Test from 01/03/2017 in [facility]  Date  01/03/17  Referring Provider  Dr. Einar Gip      Visit Diagnosis: Pulmonary hypertension ([place])  Patient's Home Medications on Admission:   Current Outpatient Prescriptions:  .  amLODipine (NORVASC) 5 MG tablet, Take 5 mg by mouth daily., Disp: , Rfl:  .  aspirin EC 81 MG tablet, Take 81 mg by mouth daily., Disp: , Rfl:  .  carvedilol (COREG) 6.25 MG tablet, Take 6.25 mg by mouth 2 (two) times daily with a meal., Disp: , Rfl:  .  losartan (COZAAR) 100 MG tablet, Take 100 mg by mouth daily., Disp: , Rfl:  .  Misc Natural Products (OSTEO BI-FLEX/5-LOXIN ADVANCED PO), Take 1 tablet by mouth 2 (two) times daily. , Disp: , Rfl:  .  mometasone (NASONEX) 50 MCG/ACT nasal spray, Place 2 sprays into the nose daily., Disp: , Rfl:  .  Multiple Vitamins-Minerals (MULTIVITAMIN ADULTS) TABS, See admin instructions. Pack of vitamins:Take 1 pack by mouth once a day, Disp: , Rfl:  .  sitaGLIPtin (JANUVIA) 25 MG tablet, Take 25 mg by mouth daily., Disp: , Rfl:   Past Medical History: Past Medical History:  Diagnosis Date  . Arthritis   . CHF (congestive heart failure) ([place])   . Diabetes mellitus without complication ([place])   . Hypertension     Tobacco Use: History  Smoking Status  . Never Smoker  Smokeless Tobacco  . Never Used    Labs: Recent Review Flowsheet Data    There is no flowsheet data to display.      Capillary Blood Glucose: Lab Results  Component Value Date   GLUCAP 94 03/07/2017   GLUCAP 71 02/21/2017   GLUCAP 89 02/16/2017   GLUCAP 73 02/16/2017   GLUCAP 136 (H) 02/16/2017        POCT Glucose    Row Name 01/10/17 1508 01/26/17 1623 02/02/17 1616 02/07/17 1532 02/09/17 1539     POCT Blood Glucose   Pre-Exercise 105 mg/dL 137 mg/dL 138 mg/dL 104 mg/dL  Pt given bananna before exercise 143 mg/dL   Post-Exercise  - 128 mg/dL 118 mg/dL 126 mg/dL 115 mg/dL   Row Name 02/14/17 1512 02/21/17 1532 02/28/17 1559 03/09/17 1615       POCT Blood Glucose   Pre-Exercise 123 mg/dL 164 mg/dL 101 mg/dL 91 mg/dL    Post-Exercise 124 mg/dL 84 mg/dL  rechecked after 8 ounces of ginger ale and increased to 95 135 mg/dL  -       ADL UCSD:   Pulmonary Function Assessment:     Pulmonary Function Assessment - 12/30/16 1219      Breath   Bilateral Breath Sounds Clear   Shortness of Breath Yes;Limiting activity      Exercise Target Goals:    Exercise Program Goal: Individual exercise prescription set with THRR, safety & activity barriers. Participant demonstrates ability to understand and report RPE using BORG scale, to self-measure pulse accurately, and to acknowledge the importance of the exercise prescription.  Exercise Prescription Goal: Starting with aerobic activity 30 plus minutes a day, 3 days per week for initial exercise prescription. Provide home  exercise prescription and guidelines that participant acknowledges understanding prior to discharge.  Activity Barriers & Risk Stratification:   6 Minute Walk:     6 Minute Walk    Row Name 01/03/17 1636         6 Minute Walk   Phase Initial     Distance 850 feet     Walk Time 6 minutes     # of Rest Breaks 0     MPH 1.6     METS 2.23     RPE 12     Perceived Dyspnea  1     Symptoms Yes (comment)     Comments all over fatigue     Resting HR 70 bpm     Resting BP 150/70     Max Ex. HR 110 bpm     Max Ex. BP 185/72     2 Minute Post BP 160/83       Interval HR   Baseline HR 70     1 Minute HR 82     2 Minute HR 82     3 Minute HR 110     4 Minute HR 110     5 Minute HR 86     6 Minute HR 86      Interval Heart Rate? Yes       Interval Oxygen   Interval Oxygen? Yes     Baseline Oxygen Saturation % 100 %     Baseline Liters of Oxygen 2 L     1 Minute Oxygen Saturation % 99 %     1 Minute Liters of Oxygen 2 L     2 Minute Oxygen Saturation % 100 %     2 Minute Liters of Oxygen 2 L     3 Minute Oxygen Saturation % 93 %     3 Minute Liters of Oxygen 2 L     4 Minute Oxygen Saturation % 92 %     4 Minute Liters of Oxygen 2 L     5 Minute Oxygen Saturation % 90 %     5 Minute Liters of Oxygen 2 L     6 Minute Oxygen Saturation % 90 %     6 Minute Liters of Oxygen 2 L        Oxygen Initial Assessment:     Oxygen Initial Assessment - 01/03/17 1635      Home Oxygen   Home Oxygen Device E-Tanks;Home Concentrator   Sleep Oxygen Prescription Continuous   Liters per minute 2   Home Exercise Oxygen Prescription Continuous   Liters per minute 2   Home at Rest Exercise Oxygen Prescription Continuous   Liters per minute 2   Compliance with Home Oxygen Use Yes     Initial 6 min Walk   Oxygen Used Continuous;E-Tanks   Liters per minute 2   Resting Oxygen Saturation  during 6 min walk 100 %   Exercise Oxygen Saturation  during 6 min walk 90 %     Program Oxygen Prescription   Program Oxygen Prescription Continuous;E-Tanks   Liters per minute 2     Intervention   Short Term Goals To learn and exhibit compliance with exercise, home and travel O2 prescription      Oxygen Re-Evaluation:     Oxygen Re-Evaluation    Row Name 01/31/17 0905 02/28/17 0933 03/28/17 0939         Program Oxygen Prescription   Program Oxygen Prescription Continuous;E-Tanks Continuous;E-Tanks  Continuous;E-Tanks     Liters per minute _0 Home Oxygen   Home Oxygen Device Home Concentrator;E-Tanks Home Concentrator;E-Tanks Home Concentrator;E-Tanks     Sleep Oxygen Prescription Continuous Continuous Continuous     Liters per minute _1 Home Exercise Oxygen Prescription  Continuous Continuous Continuous     Liters per minute _2 Home at Rest Exercise Oxygen Prescription Continuous Continuous Continuous     Liters per minute _3 Compliance with Home Oxygen Use Yes Yes Yes       Goals/Expected Outcomes   Short Term Goals To learn and exhibit compliance with exercise, home and travel O2 prescription To learn and exhibit compliance with exercise, home and travel O2 prescription To learn and exhibit compliance with exercise, home and travel O2 prescription     Long  Term Goals Exhibits compliance with exercise, home and travel O2 prescription Exhibits compliance with exercise, home and travel O2 prescription Exhibits compliance with exercise, home and travel O2 prescription     Comments is compliant with home oxygen continues to be compliant with oxygen prescription continues to be compliant with oxygen prescription     Goals/Expected Outcomes continued compliance continued compliance continued compliance        Oxygen Discharge (Final Oxygen Re-Evaluation):     Oxygen Re-Evaluation - 03/28/17 0939      Program Oxygen Prescription   Program Oxygen Prescription Continuous;E-Tanks   Liters per minute 2     Home Oxygen   Home Oxygen Device Home Concentrator;E-Tanks   Sleep Oxygen Prescription Continuous   Liters per minute 2   Home Exercise Oxygen Prescription Continuous   Liters per minute 2   Home at Rest Exercise Oxygen Prescription Continuous   Liters per minute 2   Compliance with Home Oxygen Use Yes     Goals/Expected Outcomes   Short Term Goals To learn and exhibit compliance with exercise, home and travel O2 prescription   Long  Term Goals Exhibits compliance with exercise, home and travel O2 prescription   Comments continues to be compliant with oxygen prescription   Goals/Expected Outcomes continued compliance      Initial Exercise Prescription:     Initial Exercise Prescription - 01/03/17 1600      Date of Initial  Exercise RX and Referring Provider   Date 01/03/17   Referring Provider Dr. Einar Gip     Oxygen   Oxygen Continuous   Liters 2     NuStep   Level 2   Minutes 17   METs 1.5     Arm Ergometer   Level 2   Minutes 17     Track   Laps 5   Minutes 17     Prescription Details   Frequency (times per week) 2   Duration Progress to 45 minutes of aerobic exercise without signs/symptoms of physical distress     Intensity   THRR 40-80% of Max Heartrate 52-105   Ratings of Perceived Exertion 11-13   Perceived Dyspnea 0-4     Progression   Progression Continue progressive overload as per policy without signs/symptoms or physical distress.     Resistance Training   Training Prescription Yes   Weight orange bands   Reps 10-15      Perform Capillary Blood Glucose checks as needed.  Exercise Prescription Changes:     Exercise Prescription Changes  Row Name 01/10/17 1500 01/12/17 1600 01/17/17 1553 01/19/17 1500 01/24/17 1500     Response to Exercise   Blood Pressure (Admit) 124/60 138/60 140/50 122/60 128/50   Blood Pressure (Exercise) 136/70 120/64 142/60 138/68 152/64   Blood Pressure (Exit) 108/54 116/60 112/58 132/60 130/60   Heart Rate (Admit) 55 bpm 69 bpm 62 bpm 67 bpm 61 bpm   Heart Rate (Exercise) 112 bpm 65 bpm 69 bpm 70 bpm 73 bpm   Heart Rate (Exit) 60 bpm 60 bpm 66 bpm 69 bpm 58 bpm   Oxygen Saturation (Admit) 97 % 100 % 100 % 98 % 92 %   Oxygen Saturation (Exercise) 90 % 100 % 97 % 100 % 99 %   Oxygen Saturation (Exit) 100 % 100 % 100 % 98 % 100 %   Rating of Perceived Exertion (Exercise) _0 Perceived Dyspnea (Exercise) _1 Duration Progress to 45 minutes of aerobic exercise without signs/symptoms of physical distress Progress to 45 minutes of aerobic exercise without signs/symptoms of physical distress Progress to 45 minutes of aerobic exercise without signs/symptoms of physical distress Progress to 45 minutes of aerobic exercise without  signs/symptoms of physical distress Progress to 45 minutes of aerobic exercise without signs/symptoms of physical distress   Intensity _2      Resistance Training   Training Prescription _3    Weight _4    Reps 10-15 10-15 10-15 10-15 10-15   Time 10 Minutes 10 Minutes 10 Minutes 10 Minutes 10 Minutes     Interval Training   Interval Training  - No No No No     Oxygen   Oxygen _5    Liters _6 NuStep   Level _7 Minutes _8 METs 1.8 1.7 1.6 1.7 1.9     Arm Ergometer   Level 2  - 2  - 2   Minutes 17  - 17  - 17     Track   Laps _9 Minutes _10 Row Name 01/26/17 1600 02/02/17 1600 02/07/17 1500 02/09/17 1500 02/14/17 1500     Response to Exercise   Blood Pressure (Admit) 126/40 130/54 136/60 132/50 124/60   Blood Pressure (Exercise) 128/70 136/80 120/52 124/50 130/70   Blood Pressure (Exit) 130/42 118/60 122/64 126/60 110/56   Heart Rate (Admit) 64 bpm 55 bpm 63 bpm 67 bpm 62 bpm   Heart Rate (Exercise) 71 bpm 65 bpm 71 bpm 79 bpm 59 bpm   Heart Rate (Exit) 61 bpm 69 bpm 64 bpm 69 bpm 63 bpm   Oxygen Saturation (Admit) 98 % 98 % 100 % 97 % 100 %   Oxygen Saturation (Exercise) 98 % 98 % 100 % 99 % 98 %   Oxygen Saturation (Exit) 98 % 100 % 100 % 99 % 93 %   Rating of Perceived Exertion (Exercise) _11 Perceived Dyspnea (Exercise) _12 Duration Progress to 45 minutes of aerobic exercise without signs/symptoms of physical distress Progress to 45 minutes of aerobic exercise without signs/symptoms of physical distress Progress to 45 minutes of aerobic exercise without signs/symptoms of  physical distress Progress to 45 minutes of aerobic exercise without signs/symptoms of physical distress Progress to 45 minutes of  aerobic exercise without signs/symptoms of physical distress   Intensity _0      Progression   Progression Continue to progress workloads to maintain intensity without signs/symptoms of physical distress. Continue to progress workloads to maintain intensity without signs/symptoms of physical distress. Continue to progress workloads to maintain intensity without signs/symptoms of physical distress. Continue to progress workloads to maintain intensity without signs/symptoms of physical distress. Continue to progress workloads to maintain intensity without signs/symptoms of physical distress.     Resistance Training   Training Prescription _1    Weight _2    Reps 10-15 10-15 10-15 10-15 10-15   Time 10 Minutes 10 Minutes 10 Minutes 10 Minutes 10 Minutes     Interval Training   Interval Training _3      Oxygen   Oxygen _4    Liters _5 NuStep   Level 4  - _6 Minutes 17  - _7 METs 2  - 1.5 1.8 2.1     Arm Ergometer   Level  - 4 4  - 4   Minutes  - 17 17  - 17     Track   Laps _8 Minutes _9 Home Exercise Plan   Plans to continue exercise at  -  - Home (comment)  -  -   Frequency  -  - Add 3 additional days to program exercise sessions.  -  -   Row Name 02/16/17 1600 02/21/17 1500 02/23/17 1624 02/28/17 1555 03/09/17 1600     Response to Exercise   Blood Pressure (Admit) 114/52 122/56 120/62 134/54 128/52   Blood Pressure (Exercise) 162/62 144/52  - 144/64 128/60   Blood Pressure (Exit) 140/70 116/74  - 130/60 124/58   Heart Rate (Admit) 53 bpm 55 bpm  - 63 bpm 63 bpm   Heart Rate (Exercise) 69 bpm 70 bpm 70 bpm 96 bpm 82 bpm   Heart Rate (Exit) 59 bpm 59 bpm  - 75 bpm 67 bpm   Oxygen Saturation (Admit) 100 % 100 %  - 100 %  100 %   Oxygen Saturation (Exercise) 98 % 100 % 100 % 95 % 98 %   Oxygen Saturation (Exit) 100 % 99 %  - 99 % 97 %   Rating of Perceived Exertion (Exercise) 9 15  - 13 13   Perceived Dyspnea (Exercise) 1 2  - 1 3   Duration Progress to 45 minutes of aerobic exercise without signs/symptoms of physical distress Progress to 45 minutes of aerobic exercise without signs/symptoms of physical distress Progress to 45 minutes of aerobic exercise without signs/symptoms of physical distress Progress to 45 minutes of aerobic exercise without signs/symptoms of physical distress Progress to 45 minutes of aerobic exercise without signs/symptoms of physical distress   Intensity _10      Progression   Progression Continue to progress workloads to maintain intensity without signs/symptoms of physical distress. Continue to progress workloads to maintain intensity without signs/symptoms of physical distress. Continue to progress workloads to maintain intensity without signs/symptoms  of physical distress. Continue to progress workloads to maintain intensity without signs/symptoms of physical distress. Continue to progress workloads to maintain intensity without signs/symptoms of physical distress.     Resistance Training   Training Prescription Yes Yes  - Yes Yes   Weight orange bands orange bands  - orange bands orange bands   Reps 10-15 10-15  - 10-15 10-15   Time 10 Minutes 10 Minutes  - 10 Minutes 10 Minutes     Interval Training   Interval Training No No  - No No     Oxygen   Oxygen _0    Liters _1 NuStep   Level 4 4  - 5  -   Minutes 17 34  - 15  -   METs  - 2  - 1.8  -     Arm Ergometer   Level  -  -  - 3 3   Minutes  -  -  - 17 17     Track   Laps 7 6  - 6 6   Minutes 17 17  - 15 17   Row Name 03/14/17 1500 03/16/17 1554 03/21/17 1500 03/23/17 1500        Response to Exercise   Blood Pressure (Admit) 140/54 120/64 140/60 148/64    Blood Pressure (Exercise) 140/70 130/64 120/50 160/70    Blood Pressure (Exit) 110/60 136/66 122/70 118/70    Heart Rate (Admit) 62 bpm 68 bpm 64 bpm 66 bpm    Heart Rate (Exercise) 81 bpm 63 bpm 63 bpm 63 bpm    Heart Rate (Exit) 60 bpm 65 bpm 67 bpm 58 bpm    Oxygen Saturation (Admit) 100 % 91 % 99 % 100 %    Oxygen Saturation (Exercise) 94 % 100 % 100 % 100 %    Oxygen Saturation (Exit) 99 % 96 % 98 % 100 %    Rating of Perceived Exertion (Exercise) _2 Perceived Dyspnea (Exercise) _3 Duration Progress to 45 minutes of aerobic exercise without signs/symptoms of physical distress Progress to 45 minutes of aerobic exercise without signs/symptoms of physical distress Progress to 45 minutes of aerobic exercise without signs/symptoms of physical distress Progress to 45 minutes of aerobic exercise without signs/symptoms of physical distress    Intensity THRR unchanged THRR unchanged THRR unchanged THRR unchanged      Progression   Progression Continue to progress workloads to maintain intensity without signs/symptoms of physical distress. Continue to progress workloads to maintain intensity without signs/symptoms of physical distress. Continue to progress workloads to maintain intensity without signs/symptoms of physical distress. Continue to progress workloads to maintain intensity without signs/symptoms of physical distress.      Resistance Training   Training Prescription Yes Yes Yes Yes    Weight orange bands orange bands orange bands orange bands    Reps 10-15 10-15 10-15 10-15    Time 10 Minutes 10 Minutes 10 Minutes 10 Minutes      Interval Training   Interval Training No No No No      Oxygen   Oxygen Continuous Continuous Continuous Continuous    Liters _4 NuStep   Level _5 -    Minutes _6 -    METs 1.7 1.8 1.8  -  Arm Ergometer   Level _0 Minutes  _1 Track   Laps 5  - 6 7    Minutes 17  - 17 17       Exercise Comments:     Exercise Comments    Row Name 02/07/17 1630           Exercise Comments Home exercise completed          Exercise Goals and Review:     Exercise Goals    Row Name 12/30/16 1040             Exercise Goals   Increase Physical Activity Yes       Intervention Provide advice, education, support and counseling about physical activity/exercise needs.;Develop an individualized exercise prescription for aerobic and resistive training based on initial evaluation findings, risk stratification, comorbidities and participant's personal goals.       Expected Outcomes Achievement of increased cardiorespiratory fitness and enhanced flexibility, muscular endurance and strength shown through measurements of functional capacity and personal statement of participant.       Increase Strength and Stamina Yes       Intervention Provide advice, education, support and counseling about physical activity/exercise needs.;Develop an individualized exercise prescription for aerobic and resistive training based on initial evaluation findings, risk stratification, comorbidities and participant's personal goals.       Expected Outcomes Achievement of increased cardiorespiratory fitness and enhanced flexibility, muscular endurance and strength shown through measurements of functional capacity and personal statement of participant.          Exercise Goals Re-Evaluation :     Exercise Goals Re-Evaluation    Row Name 01/30/17 1302 02/27/17 1150 03/27/17 1127         Exercise Goal Re-Evaluation   Exercise Goals Review Increase Strenth and Stamina;Increase Physical Activity Increase Physical Activity;Increase Strenth and Stamina Increase Physical Activity;Increase Strenth and Stamina     Comments Patient is progressing low and slow in regards to workload intensities. Vitals are great, rate of perceived exertion  levels are fairly light to somewhat hard. Will cont. to monitor patient and increase workloads as appropriate.  Patient is progressing low and slow in regards to workload intensities. MET levels average about 2.0. Vitals are great, rate of perceived exertion levels are fairly light to somewhat hard. Will cont. to monitor patient and increase workloads as appropriate.  Patient is progressing low and slow in regards to workload intensities. MET levels average about 2.0. Vitals are great, rate of perceived exertion levels are fairly light to somewhat hard. Will cont. to monitor patient and increase workloads as appropriate.      Expected Outcomes Through exercising at rehab and at home, the patient will be able to increase strength and stamina which will make ADL's easier to preform. Through exercising at rehab and at home, patient will increase physical strength and stamina and find ADL's easier to perform.  Through exercising at rehab and at home, patient will increase physical strength and stamina and find ADL's easier to perform.         Discharge Exercise Prescription (Final Exercise Prescription Changes):     Exercise Prescription Changes - 03/23/17 1500      Response to Exercise   Blood Pressure (Admit) 148/64   Blood Pressure (Exercise) 160/70   Blood Pressure (Exit) 118/70   Heart Rate (Admit) 66 bpm   Heart Rate (Exercise) 63 bpm   Heart  Rate (Exit) 58 bpm   Oxygen Saturation (Admit) 100 %   Oxygen Saturation (Exercise) 100 %   Oxygen Saturation (Exit) 100 %   Rating of Perceived Exertion (Exercise) 13   Perceived Dyspnea (Exercise) 3   Duration Progress to 45 minutes of aerobic exercise without signs/symptoms of physical distress   Intensity THRR unchanged     Progression   Progression Continue to progress workloads to maintain intensity without signs/symptoms of physical distress.     Resistance Training   Training Prescription Yes   Weight orange bands   Reps 10-15   Time 10  Minutes     Interval Training   Interval Training No     Oxygen   Oxygen Continuous   Liters 2     Arm Ergometer   Level 4   Minutes 17     Track   Laps 7   Minutes 17      Nutrition:  Target Goals: Understanding of nutrition guidelines, daily intake of sodium <1538m, cholesterol <2039m calories 30% from fat and 7% or less from saturated fats, daily to have 5 or more servings of fruits and vegetables.  Biometrics:     Pre Biometrics - 12/30/16 1040      Pre Biometrics   Grip Strength 15 kg       Nutrition Therapy Plan and Nutrition Goals:   Nutrition Discharge: Rate Your Plate Scores:   Nutrition Goals Re-Evaluation:   Nutrition Goals Discharge (Final Nutrition Goals Re-Evaluation):   Psychosocial: Target Goals: Acknowledge presence or absence of significant depression and/or stress, maximize coping skills, provide positive support system. Participant is able to verbalize types and ability to use techniques and skills needed for reducing stress and depression.  Initial Review & Psychosocial Screening:     Initial Psych Review & Screening - 12/30/16 1220      Initial Review   [facility] with [facility]     Family Dynamics   Good Support System? Yes   Concerns Recent loss of significant other   Comments loss of child in 2006     Barriers   Psychosocial barriers to participate in program There are no identifiable barriers or psychosocial needs.     Screening Interventions   Interventions Encouraged to exercise      Quality of Life Scores:   PHQ-9: Recent Review Flowsheet Data    [facility]   Decreased Interest 1   Down, Depressed, Hopeless 1   PHQ - 2 Score 2   Altered sleeping 1   Tired, decreased energy 3   Change in appetite 2   Feeling bad or failure about yourself  0   Trouble concentrating 0   Moving slowly or fidgety/restless 0   Suicidal thoughts 0   PHQ-9 Score 8   Difficult  doing work/chores Not difficult at all     Interpretation of Total Score  Total Score Depression Severity:  1-4 = Minimal depression, 5-9 = Mild depression, 10-14 = Moderate depression, 15-19 = Moderately severe depression, 20-27 = Severe depression   Psychosocial Evaluation and Intervention:   Psychosocial Re-Evaluation:     Psychosocial Re-Evaluation    Row Name 01/31/17 [id number]/05/18 [id number]/03/18 0941         Psychosocial Re-Evaluation   Current issues with None Identified None Identified None Identified     Interventions Encouraged to attend Pulmonary Rehabilitation for the exercise Encouraged to attend Pulmonary Rehabilitation for the exercise Encouraged to  attend Pulmonary Rehabilitation for the exercise     Continue Psychosocial Services  No Follow up required No Follow up required No Follow up required        Psychosocial Discharge (Final Psychosocial Re-Evaluation):     Psychosocial Re-Evaluation - 03/28/17 0941      Psychosocial Re-Evaluation   Current issues with None Identified   Interventions Encouraged to attend Pulmonary Rehabilitation for the exercise   Continue Psychosocial Services  No Follow up required      Education: Education Goals: Education classes will be provided on a weekly basis, covering required topics. Participant will state understanding/return demonstration of topics presented.  Learning Barriers/Preferences:     Learning Barriers/Preferences - 12/30/16 1126      Learning Barriers/Preferences   Learning Barriers None   Learning Preferences Computer/Internet;Group Instruction;Verbal Instruction;Written Material      Education Topics: Risk Factor Reduction:  -Group instruction that is supported by a PowerPoint presentation. Instructor discusses the definition of a risk factor, different risk factors for pulmonary disease, and how the heart and lungs work together.     Nutrition for Pulmonary Patient:  -Group instruction provided  by PowerPoint slides, verbal discussion, and written materials to support subject matter. The instructor gives an explanation and review of healthy diet recommendations, which includes a discussion on weight management, recommendations for fruit and vegetable consumption, as well as protein, fluid, caffeine, fiber, sodium, sugar, and alcohol. Tips for eating when patients are short of breath are discussed.   PULMONARY REHAB OTHER RESPIRATORY from 03/23/2017 in [place]  Date  03/16/17  Educator  edna  Instruction Review Code  2- meets goals/outcomes      Pursed Lip Breathing:  -Group instruction that is supported by demonstration and informational handouts. Instructor discusses the benefits of pursed lip and diaphragmatic breathing and detailed demonstration on how to preform both.     Oxygen Safety:  -Group instruction provided by PowerPoint, verbal discussion, and written material to support subject matter. There is an overview of "What is Oxygen" and "Why do we need it".  Instructor also reviews how to create a safe environment for oxygen use, the importance of using oxygen as prescribed, and the risks of noncompliance. There is a brief discussion on traveling with oxygen and resources the patient may utilize.   PULMONARY REHAB OTHER RESPIRATORY from 03/23/2017 in [place]  Date  03/09/17  Educator  portia  Instruction Review Code  2- meets goals/outcomes      Oxygen Equipment:  -Group instruction provided by [employer] Staff utilizing handouts, written materials, and equipment demonstrations.   PULMONARY REHAB OTHER RESPIRATORY from 03/23/2017 in [place]  Date  02/16/17  Educator  Ace Gins  Instruction Review Code  2- meets goals/outcomes      Signs and Symptoms:  -Group instruction provided by written material and verbal discussion to support subject matter. Warning signs and  symptoms of infection, stroke, and heart attack are reviewed and when to call the physician/911 reinforced. Tips for preventing the spread of infection discussed.   PULMONARY REHAB OTHER RESPIRATORY from 03/23/2017 in [place]  Date  01/26/17  Educator  RN  Instruction Review Code  2- meets goals/outcomes      Advanced Directives:  -Group instruction provided by verbal instruction and written material to support subject matter. Instructor reviews Advanced Directive laws and proper instruction for filling out document.   Pulmonary Video:  -  Group video education that reviews the importance of medication and oxygen compliance, exercise, good nutrition, pulmonary hygiene, and pursed lip and diaphragmatic breathing for the pulmonary patient.   Exercise for the Pulmonary Patient:  -Group instruction that is supported by a PowerPoint presentation. Instructor discusses benefits of exercise, core components of exercise, frequency, duration, and intensity of an exercise routine, importance of utilizing pulse oximetry during exercise, safety while exercising, and options of places to exercise outside of rehab.     PULMONARY REHAB OTHER RESPIRATORY from 03/23/2017 in [place]  Date  03/23/17  Educator  EP  Instruction Review Code  2- meets goals/outcomes      Pulmonary Medications:  -Verbally interactive group education provided by instructor with focus on inhaled medications and proper administration.   PULMONARY REHAB OTHER RESPIRATORY from 03/23/2017 in [place]  Date  02/09/17  Educator  Pharm  Instruction Review Code  2- meets goals/outcomes      Anatomy and Physiology of the Respiratory System and Intimacy:  -Group instruction provided by PowerPoint, verbal discussion, and written material to support subject matter. Instructor reviews respiratory cycle and anatomical components of the  respiratory system and their functions. Instructor also reviews differences in obstructive and restrictive respiratory diseases with examples of each. Intimacy, Sex, and Sexuality differences are reviewed with a discussion on how relationships can change when diagnosed with pulmonary disease. Common sexual concerns are reviewed.   PULMONARY REHAB OTHER RESPIRATORY from 03/23/2017 in [place]  Date  02/02/17  Educator  RN  Instruction Review Code  2- meets goals/outcomes      MD DAY -A group question and answer session with a medical doctor that allows participants to ask questions that relate to their pulmonary disease state.   OTHER EDUCATION -Group or individual verbal, written, or video instructions that support the educational goals of the pulmonary rehab program.   Knowledge Questionnaire Score:   Core Components/Risk Factors/Patient Goals at Admission:     Personal Goals and Risk Factors at Admission - 12/30/16 1219      Core Components/Risk Factors/Patient Goals on Admission   Improve shortness of breath with ADL's Yes   Intervention Provide education, individualized exercise plan and daily activity instruction to help decrease symptoms of SOB with activities of daily living.   Expected Outcomes Short Term: Achieves a reduction of symptoms when performing activities of daily living.   Develop more efficient breathing techniques such as purse lipped breathing and diaphragmatic breathing; and practicing self-pacing with activity Yes   Intervention Provide education, demonstration and support about specific breathing techniuqes utilized for more efficient breathing. Include techniques such as pursed lipped breathing, diaphragmatic breathing and self-pacing activity.   Expected Outcomes Short Term: Participant will be able to demonstrate and use breathing techniques as needed throughout daily activities.   Hypertension Yes   Intervention Provide  education on lifestyle modifcations including regular physical activity/exercise, weight management, moderate sodium restriction and increased consumption of fresh fruit, vegetables, and low fat dairy, alcohol moderation, and smoking cessation.;Monitor prescription use compliance.   Expected Outcomes Short Term: Continued assessment and intervention until BP is < 140/65m HG in hypertensive participants. < 130/862mHG in hypertensive participants with diabetes, heart failure or chronic kidney disease.;Long Term: Maintenance of blood pressure at goal levels.      Core Components/Risk Factors/Patient Goals Review:      Goals and Risk Factor Review    Row Name 01/31/17 [id number]  02/28/17 4128 03/28/17 0940         Core Components/Risk Factors/Patient Goals Review   Personal Goals Review Hypertension;Improve shortness of breath with ADL's;Develop more efficient breathing techniques such as purse lipped breathing and diaphragmatic breathing and practicing self-pacing with activity. Hypertension;Improve shortness of breath with ADL's;Develop more efficient breathing techniques such as purse lipped breathing and diaphragmatic breathing and practicing self-pacing with activity. Hypertension;Develop more efficient breathing techniques such as purse lipped breathing and diaphragmatic breathing and practicing self-pacing with activity.;Improve shortness of breath with ADL's     Review blood pressure is controlled, just started program, performing PLB, too early to see dramatic improvements BP continues to be controlled, progressing with workloads increases, regular attendance, SOB is improving, PLB demonstrated all goals are being met     Expected Outcomes Expect to see more improvement in goals in next 30 days continue to see improvement with progression of program should graduate in the next 30 days, expect her to continue exercising after graduation        Core Components/Risk Factors/Patient Goals at  Discharge (Final Review):      Goals and Risk Factor Review - 03/28/17 0940      Core Components/Risk Factors/Patient Goals Review   Personal Goals Review Hypertension;Develop more efficient breathing techniques such as purse lipped breathing and diaphragmatic breathing and practicing self-pacing with activity.;Improve shortness of breath with ADL's   Review all goals are being met   Expected Outcomes should graduate in the next 30 days, expect her to continue exercising after graduation      ITP Comments:   Comments: ITP REVIEW Pt is making expected progress toward pulmonary rehab goals after completing 19 sessions. Recommend continued exercise, life style modification, education, and utilization of breathing techniques to increase stamina and strength and decrease shortness of breath with exertion.

## 2017-03-30 ENCOUNTER — Encounter (HOSPITAL_COMMUNITY): Payer: Medicare Other

## 2017-04-04 ENCOUNTER — Encounter (HOSPITAL_COMMUNITY)
Admission: RE | Admit: 2017-04-04 | Discharge: 2017-04-04 | Disposition: A | Discharge status: Home / Self Care | Payer: Medicare Other | Source: Ambulatory Visit | Attending: Cardiology | Admitting: Cardiology

## 2017-04-04 VITALS — Wt 165.1 lb

## 2017-04-04 DIAGNOSIS — E119 Type 2 diabetes mellitus without complications: Secondary | ICD-10-CM | POA: Diagnosis not present

## 2017-04-04 DIAGNOSIS — I509 Heart failure, unspecified: Secondary | ICD-10-CM | POA: Diagnosis not present

## 2017-04-04 DIAGNOSIS — Z7984 Long term (current) use of oral hypoglycemic drugs: Secondary | ICD-10-CM | POA: Diagnosis not present

## 2017-04-04 DIAGNOSIS — Z7982 Long term (current) use of aspirin: Secondary | ICD-10-CM | POA: Diagnosis not present

## 2017-04-04 DIAGNOSIS — I272 Pulmonary hypertension, unspecified: Secondary | ICD-10-CM | POA: Diagnosis present

## 2017-04-04 DIAGNOSIS — M1990 Unspecified osteoarthritis, unspecified site: Secondary | ICD-10-CM | POA: Diagnosis not present

## 2017-04-04 DIAGNOSIS — Z79899 Other long term (current) drug therapy: Secondary | ICD-10-CM | POA: Diagnosis not present

## 2017-04-04 DIAGNOSIS — I110 Hypertensive heart disease with heart failure: Secondary | ICD-10-CM | POA: Diagnosis not present

## 2017-04-04 NOTE — Progress Notes (Signed)
Daily Session Note  Patient Details  Name: Catherine Vazquez MRN: [id number] Date of Birth: 03/08/1925 Referring Provider:     Pulmonary Rehab Walk Test from 01/03/2017 in [facility]  Referring Provider  Dr. Ganji      Encounter Date: 04/04/2017  Check In:     Session Check In - 04/04/17 1330      Check-In   Location [facility]   Staff Present Joan Behrens, RN, BSN;Lisa Hughes, RN;Portia Payne, RN, BSN   Supervising physician immediately available to respond to emergencies [facility] immediately available   Physician(s) Dr. Myers   Medication changes reported     No   Fall or balance concerns reported    No   Tobacco Cessation No Change   Warm-up and Cool-down Performed as group-led instruction   Resistance Training Performed Yes   VAD Patient? No     Pain Assessment   Currently in Pain? No/denies   Multiple Pain Sites No      Capillary Blood Glucose: No results found for this or any previous visit (from the past 24 hour(s)).      Exercise Prescription Changes - 04/04/17 1523      Response to Exercise   Blood Pressure (Admit) 140/52   Blood Pressure (Exercise) 124/56   Blood Pressure (Exit) 118/60   Heart Rate (Admit) 65 bpm   Heart Rate (Exercise) 70 bpm   Heart Rate (Exit) 59 bpm   Oxygen Saturation (Admit) 99 %   Oxygen Saturation (Exercise) 100 %   Oxygen Saturation (Exit) 99 %   Rating of Perceived Exertion (Exercise) 15   Perceived Dyspnea (Exercise) 2   Duration Progress to 45 minutes of aerobic exercise without signs/symptoms of physical distress   Intensity THRR unchanged     Progression   Progression Continue to progress workloads to maintain intensity without signs/symptoms of physical distress.     Resistance Training   Training Prescription Yes   Weight orange bands   Reps 10-15   Time 10 Minutes     Interval Training   Interval Training No     Oxygen   Oxygen Continuous   Liters 2     NuStep   Level 5   Minutes 17   METs 1.7     Arm Ergometer   Level 4   Minutes 17     Track   Laps 6   Minutes 17      History  Smoking Status  . Never Smoker  Smokeless Tobacco  . Never Used    Goals Met:  Improved SOB with ADL's Using PLB without cueing & demonstrates good technique Exercise tolerated well No report of cardiac concerns or symptoms Strength training completed today  Goals Unmet:  Not Applicable  Comments: Service time is from 1330 to 1455   Dr. Wesam G. Yacoub is Medical Director for Pulmonary Rehab at [facility]. 

## 2017-04-06 ENCOUNTER — Encounter (HOSPITAL_COMMUNITY)
Admission: RE | Admit: 2017-04-06 | Discharge: 2017-04-06 | Disposition: A | Discharge status: Home / Self Care | Payer: Medicare Other | Source: Ambulatory Visit | Attending: Cardiology | Admitting: Cardiology

## 2017-04-06 VITALS — Wt 165.8 lb

## 2017-04-06 DIAGNOSIS — I272 Pulmonary hypertension, unspecified: Secondary | ICD-10-CM

## 2017-04-06 NOTE — Progress Notes (Signed)
Daily Session Note  Patient Details  Name: Catherine Vazquez MRN: [id number] Date of Birth: 1924/11/28 Referring Provider:     Pulmonary Rehab Walk Test from 01/03/2017 in [place]  Referring Provider  Dr. Einar Gip      Encounter Date: 04/06/2017  Check In:     Session Check In - 04/06/17 1340      Check-In   [facility]   Staff Present Trish Fountain, RN, Maxcine Ham, RN, Roque Cash, RN   Physician(s) Dr. Maryland Pink   Medication changes reported     No   Fall or balance concerns reported    No   Tobacco Cessation No Change   Warm-up and Cool-down Performed as group-led instruction   Resistance Training Performed Yes     Pain Assessment   Multiple Pain Sites No      Capillary Blood Glucose: No results found for this or any previous visit (from the past 24 hour(s)).      Exercise Prescription Changes - 04/06/17 1500      Response to Exercise   Blood Pressure (Admit) 122/60   Blood Pressure (Exercise) 152/64   Blood Pressure (Exit) 142/62   Heart Rate (Admit) 63 bpm   Heart Rate (Exercise) 80 bpm   Heart Rate (Exit) 62 bpm   Oxygen Saturation (Admit) 100 %   Oxygen Saturation (Exercise) 97 %   Oxygen Saturation (Exit) 100 %   Rating of Perceived Exertion (Exercise) 15   Perceived Dyspnea (Exercise) 2   Duration Progress to 45 minutes of aerobic exercise without signs/symptoms of physical distress   Intensity THRR unchanged     Progression   Progression Continue to progress workloads to maintain intensity without signs/symptoms of physical distress.     Resistance Training   Training Prescription Yes   Weight orange bands   Reps 10-15   Time 10 Minutes     Interval Training   Interval Training No     Oxygen   Oxygen Continuous   Liters 2     Arm Ergometer   Level 4   Minutes 17     Track   Laps 7   Minutes 17      History  Smoking Status  . Never Smoker  Smokeless Tobacco  .  Never Used    Goals Met:  No report of cardiac concerns or symptoms Strength training completed today  Goals Unmet:  Not Applicable  Comments: Service time is from 1330 to 1510    Dr. Rush Farmer is Medical Director for Pulmonary Rehab at [facility].

## 2017-04-11 ENCOUNTER — Encounter (HOSPITAL_COMMUNITY)
Admission: RE | Admit: 2017-04-11 | Discharge: 2017-04-11 | Disposition: A | Discharge status: Home / Self Care | Payer: Medicare Other | Source: Ambulatory Visit | Attending: Cardiology | Admitting: Cardiology

## 2017-04-11 VITALS — Wt 168.4 lb

## 2017-04-11 DIAGNOSIS — I272 Pulmonary hypertension, unspecified: Secondary | ICD-10-CM | POA: Diagnosis not present

## 2017-04-11 NOTE — Progress Notes (Signed)
Daily Session Note  Patient Details  Name: Catherine Vazquez MRN: [id number] Date of Birth: 1924-10-27 Referring Provider:     Pulmonary Rehab Walk Test from 01/03/2017 in [place]  Referring Provider  Dr. Einar Gip      Encounter Date: 04/11/2017  Check In:     Session Check In - 04/11/17 1330      Check-In   [facility]   Staff Present Rosebud Poles, RN, BSN;Lisa Ysidro Evert, RN;Molly diVincenzo, MS, ACSM RCEP, Exercise Physiologist   Supervising physician immediately available to respond to emergencies [facility] immediately available   Physician(s) Dr. Maryland Pink   Medication changes reported     No   Fall or balance concerns reported    No   Tobacco Cessation No Change   Warm-up and Cool-down Performed as group-led instruction   Resistance Training Performed Yes   VAD Patient? No     Pain Assessment   Currently in Pain? No/denies   Multiple Pain Sites No      Capillary Blood Glucose: No results found for this or any previous visit (from the past 24 hour(s)).      Exercise Prescription Changes - 04/11/17 1522      Response to Exercise   Blood Pressure (Admit) 128/66   Blood Pressure (Exercise) 124/70   Blood Pressure (Exit) 130/56   Heart Rate (Admit) 66 bpm   Heart Rate (Exercise) 76 bpm   Heart Rate (Exit) 69 bpm   Oxygen Saturation (Admit) 99 %   Oxygen Saturation (Exercise) 100 %   Oxygen Saturation (Exit) 99 %   Rating of Perceived Exertion (Exercise) 11   Perceived Dyspnea (Exercise) 2   Duration Progress to 45 minutes of aerobic exercise without signs/symptoms of physical distress   Intensity THRR unchanged     Progression   Progression Continue to progress workloads to maintain intensity without signs/symptoms of physical distress.     Resistance Training   Training Prescription Yes   Weight orange bands   Reps 10-15   Time 10 Minutes     Interval Training   Interval Training No     Oxygen   Oxygen Continuous   Liters 2     NuStep   Level 5   Minutes 17   METs 1.8     Arm Ergometer   Level 4   Minutes 17     Track   Laps 8   Minutes 17      History  Smoking Status  . Never Smoker  Smokeless Tobacco  . Never Used    Goals Met:  Independence with exercise equipment Improved SOB with ADL's Using PLB without cueing & demonstrates good technique Exercise tolerated well No report of cardiac concerns or symptoms Strength training completed today  Goals Unmet:  Not Applicable  Comments: Service time is from 1330 to 1455   Dr. Rush Farmer is Medical Director for Pulmonary Rehab at [facility].

## 2017-04-13 ENCOUNTER — Encounter (HOSPITAL_COMMUNITY)
Admission: RE | Admit: 2017-04-13 | Discharge: 2017-04-13 | Disposition: A | Discharge status: Home / Self Care | Payer: Medicare Other | Source: Ambulatory Visit | Attending: Cardiology | Admitting: Cardiology

## 2017-04-13 VITALS — Wt 169.1 lb

## 2017-04-13 DIAGNOSIS — I272 Pulmonary hypertension, unspecified: Secondary | ICD-10-CM

## 2017-04-13 NOTE — Progress Notes (Signed)
Daily Session Note  Patient Details  Name: Catherine Vazquez MRN: [id number] Date of Birth: 1925-09-17 Referring Provider:     Pulmonary Rehab Walk Test from 01/03/2017 in [place]  Referring Provider  Dr. Einar Gip      Encounter Date: 04/13/2017  Check In:     Session Check In - 04/13/17 1351      Check-In   [facility]   Staff Present Su Hilt, MS, ACSM RCEP, Exercise Physiologist;Angelgabriel Willmore Leonia Reeves, RN, BSN;Lisa Hughes, RN;Portia Rollene Rotunda, RN, BSN   Supervising physician immediately available to respond to emergencies [facility] immediately available   Physician(s) Dr. Burnis Medin   Medication changes reported     No   Fall or balance concerns reported    No   Tobacco Cessation No Change   Warm-up and Cool-down Performed as group-led instruction   Resistance Training Performed Yes   VAD Patient? No     Pain Assessment   Currently in Pain? No/denies   Multiple Pain Sites No      Capillary Blood Glucose: No results found for this or any previous visit (from the past 24 hour(s)).      Exercise Prescription Changes - 04/13/17 1600      Response to Exercise   Blood Pressure (Admit) 134/60   Blood Pressure (Exercise) 160/72   Blood Pressure (Exit) 142/64   Heart Rate (Admit) 60 bpm   Heart Rate (Exercise) 64 bpm   Heart Rate (Exit) 60 bpm   Oxygen Saturation (Admit) 100 %   Oxygen Saturation (Exercise) 98 %   Oxygen Saturation (Exit) 100 %   Rating of Perceived Exertion (Exercise) 11   Perceived Dyspnea (Exercise) 0   Duration Progress to 45 minutes of aerobic exercise without signs/symptoms of physical distress   Intensity THRR unchanged     Progression   Progression Continue to progress workloads to maintain intensity without signs/symptoms of physical distress.     Resistance Training   Training Prescription Yes   Weight orange bands   Reps 10-15   Time 10 Minutes     Interval Training   Interval Training No     Oxygen   Oxygen Continuous   Liters 2     NuStep   Level 5   Minutes 17   METs 1.9     Track   Laps 4   Minutes 17      History  Smoking Status  . Never Smoker  Smokeless Tobacco  . Never Used    Goals Met:  Exercise tolerated well Strength training completed today  Goals Unmet:  Not Applicable  Comments: Service time is from 1330 to 1530    Dr. Rush Farmer is Medical Director for Pulmonary Rehab at [facility].

## 2017-04-18 ENCOUNTER — Encounter (HOSPITAL_COMMUNITY)
Admission: RE | Admit: 2017-04-18 | Discharge: 2017-04-18 | Disposition: A | Discharge status: Home / Self Care | Payer: Medicare Other | Source: Ambulatory Visit | Attending: Cardiology | Admitting: Cardiology

## 2017-04-18 DIAGNOSIS — I272 Pulmonary hypertension, unspecified: Secondary | ICD-10-CM

## 2017-04-18 NOTE — Progress Notes (Signed)
Catherine Vazquez completed a Six-Minute Walk Test on 04/18/17 . Catherine Vazquez walked 831 feet with 0 breaks.  The patient's lowest oxygen saturation was 100%, highest heart rate was 81 bpm , and highest blood pressure was 156/80. The patient was on 3 liters of oxygen with a nasal cannula. Patient stated that nothing hindered their walk test.

## 2017-04-20 ENCOUNTER — Encounter (HOSPITAL_COMMUNITY): Payer: Medicare Other

## 2017-04-25 ENCOUNTER — Encounter (HOSPITAL_COMMUNITY): Payer: Medicare Other

## 2017-04-27 ENCOUNTER — Encounter (HOSPITAL_COMMUNITY): Payer: Medicare Other

## 2017-04-28 NOTE — Addendum Note (Signed)
Encounter addended by: Jacques Earthly, RD on: 04/28/2017  2:27 PM<BR>    Actions taken: Visit Navigator Flowsheet section accepted

## 2017-05-02 ENCOUNTER — Encounter (HOSPITAL_COMMUNITY): Payer: Medicare Other

## 2017-05-04 ENCOUNTER — Encounter (HOSPITAL_COMMUNITY): Payer: Medicare Other

## 2017-05-09 ENCOUNTER — Encounter (HOSPITAL_COMMUNITY): Payer: Medicare Other

## 2017-05-30 ENCOUNTER — Encounter (HOSPITAL_COMMUNITY): Payer: Self-pay

## 2017-05-30 DIAGNOSIS — I272 Pulmonary hypertension, unspecified: Secondary | ICD-10-CM

## 2017-05-30 NOTE — Progress Notes (Signed)
Discharge Progress Report  Patient Details  Name: Catherine Vazquez MRN: [id number] Date of Birth: [birth year] Referring Provider:     Pulmonary Rehab Walk Test from 01/03/2017 in [place]  Referring Provider  Dr. Einar Gip       Number of Visits: 24  Reason for Discharge:  Patient reached a stable level of exercise. Patient independent in their exercise. Patient has met program and personal goals.  Smoking History:  History  Smoking Status  . Never Smoker  Smokeless Tobacco  . Never Used    Diagnosis:  Pulmonary hypertension ([place])  ADL UCSD:     Pulmonary Assessment Scores    Row Name 04/13/17 1402         ADL UCSD   ADL Phase Exit     SOB Score total 60       CAT Score   CAT Score 18  Exit        Initial Exercise Prescription:   Discharge Exercise Prescription (Final Exercise Prescription Changes):     Exercise Prescription Changes - 04/13/17 1600      Response to Exercise   Blood Pressure (Admit) 134/60   Blood Pressure (Exercise) 160/72   Blood Pressure (Exit) 142/64   Heart Rate (Admit) 60 bpm   Heart Rate (Exercise) 64 bpm   Heart Rate (Exit) 60 bpm   Oxygen Saturation (Admit) 100 %   Oxygen Saturation (Exercise) 98 %   Oxygen Saturation (Exit) 100 %   Rating of Perceived Exertion (Exercise) 11   Perceived Dyspnea (Exercise) 0   Duration Progress to 45 minutes of aerobic exercise without signs/symptoms of physical distress   Intensity THRR unchanged     Progression   Progression Continue to progress workloads to maintain intensity without signs/symptoms of physical distress.     Resistance Training   Training Prescription Yes   Weight orange bands   Reps 10-15   Time 10 Minutes     Interval Training   Interval Training No     Oxygen   Oxygen Continuous   Liters 2     NuStep   Level 5   Minutes 17   METs 1.9     Track   Laps 4   Minutes 17      Functional Capacity:     6 Minute Walk     Row Name 04/18/17 1528         6 Minute Walk   Phase Discharge     Distance 831 feet     Distance % Change -2.35 %     Walk Time 6 minutes     # of Rest Breaks 0     MPH 1.57     METS 2.21     RPE 13     Perceived Dyspnea  3     VO2 Peak 3.02     Symptoms No     Resting HR 63 bpm     Resting BP 140/56     Max Ex. HR 81 bpm     Max Ex. BP 156/80     2 Minute Post BP 128/62       Interval HR   Baseline HR (retired) 63     1 Minute HR 77     2 Minute HR 81     3 Minute HR 81     4 Minute HR 79     5 Minute HR 80     6 Minute HR 80  2 Minute Post HR 60     Interval Heart Rate? Yes       Interval Oxygen   Interval Oxygen? Yes     Baseline Oxygen Saturation % 100 %     Resting Liters of Oxygen 3 L     1 Minute Oxygen Saturation % 100 %     1 Minute Liters of Oxygen 3 L     2 Minute Oxygen Saturation % 100 %     2 Minute Liters of Oxygen 3 L     3 Minute Oxygen Saturation % 100 %     3 Minute Liters of Oxygen 3 L     4 Minute Oxygen Saturation % 100 %     4 Minute Liters of Oxygen 3 L     5 Minute Oxygen Saturation % 100 %     5 Minute Liters of Oxygen 3 L     6 Minute Oxygen Saturation % 100 %     6 Minute Liters of Oxygen 3 L     2 Minute Post Oxygen Saturation % 100 %     2 Minute Post Liters of Oxygen 3 L        Psychological, QOL, Others - Outcomes: PHQ 2/9: Depression screen [facility] 2/9 04/18/2017 12/30/2016  Decreased Interest 0 1  Down, Depressed, Hopeless 1 1  PHQ - 2 Score 1 2  Altered sleeping - 1  Tired, decreased energy - 3  Change in appetite - 2  Feeling bad or failure about yourself  - 0  Trouble concentrating - 0  Moving slowly or fidgety/restless - 0  Suicidal thoughts - 0  PHQ-9 Score - 8  Difficult doing work/chores - Not difficult at all    Quality of Life:   Personal Goals: Goals established at orientation with interventions provided to work toward goal.    Personal Goals Discharge:     Goals and Risk Factor Review    Row  Name 05/30/17 1428             Core Components/Risk Factors/Patient Goals Review   Personal Goals Review Hypertension;Develop more efficient breathing techniques such as purse lipped breathing and diaphragmatic breathing and practicing self-pacing with activity.;Improve shortness of breath with ADL's       Review all goals met at time of discharge          Exercise Goals and Review:   Nutrition & Weight - Outcomes:    Nutrition:   Nutrition Discharge:   Education Questionnaire Score:     Knowledge Questionnaire Score - 04/13/17 1402      Knowledge Questionnaire Score   Post Score 10/13

## 2017-05-31 ENCOUNTER — Ambulatory Visit
Admission: RE | Admit: 2017-05-31 | Discharge: 2017-05-31 | Disposition: A | Discharge status: Home / Self Care | Payer: Medicare Other | Source: Ambulatory Visit | Attending: Family Medicine | Admitting: Family Medicine

## 2017-05-31 ENCOUNTER — Other Ambulatory Visit: Payer: Self-pay | Admitting: Family Medicine

## 2017-05-31 DIAGNOSIS — G459 Transient cerebral ischemic attack, unspecified: Secondary | ICD-10-CM

## 2017-06-13 ENCOUNTER — Encounter: Payer: Self-pay | Admitting: Neurology

## 2017-07-07 ENCOUNTER — Other Ambulatory Visit: Payer: Medicare Other

## 2017-07-07 ENCOUNTER — Ambulatory Visit (INDEPENDENT_AMBULATORY_CARE_PROVIDER_SITE_OTHER): Payer: Medicare Other | Admitting: Neurology

## 2017-07-07 ENCOUNTER — Encounter: Payer: Self-pay | Admitting: Neurology

## 2017-07-07 VITALS — BP 138/60 | HR 63 | Wt 167.0 lb

## 2017-07-07 DIAGNOSIS — I10 Essential (primary) hypertension: Secondary | ICD-10-CM

## 2017-07-07 DIAGNOSIS — Z794 Long term (current) use of insulin: Secondary | ICD-10-CM | POA: Diagnosis not present

## 2017-07-07 DIAGNOSIS — I69320 Aphasia following cerebral infarction: Secondary | ICD-10-CM | POA: Diagnosis not present

## 2017-07-07 DIAGNOSIS — I639 Cerebral infarction, unspecified: Secondary | ICD-10-CM

## 2017-07-07 DIAGNOSIS — E119 Type 2 diabetes mellitus without complications: Secondary | ICD-10-CM

## 2017-07-07 LAB — LIPID PANEL
Cholesterol: 306 mg/dL — ABNORMAL HIGH (ref <200)
HDL: 62 mg/dL (ref >50)
LDL CHOLESTEROL (CALC): 206 mg/dL — AB
NON-HDL CHOLESTEROL (CALC): 244 mg/dL — AB (ref <130)
TRIGLYCERIDES: 196 mg/dL — AB (ref <150)
Total CHOL/HDL Ratio: 4.9 (calc) (ref <5.0)

## 2017-07-07 NOTE — Progress Notes (Signed)
NEUROLOGY CONSULTATION NOTE  Catherine Vazquez MRN: [id number] DOB: 12/03/1924  Referring provider: Dr. Pecola Leisure Primary care provider: Dr. Pecola Leisure  Reason for consult:  Possible stroke  HISTORY OF PRESENT ILLNESS: Catherine Vazquez is an 83 year old right-handed female with hypertension, CHF and diabetes who presents for stroke.  She is accompanied by her daughter who supplements history.  During the week after Labor Day, Catherine Vazquez exhibited slurred speech with some word-finding difficulty but mostly difficulty producing words.  She did not exhibit facial droop, visual disturbance, ataxia or unilateral numbness or weakness.  CT of head without contrast performed on 06/01/17 was personally reviewed and revealed atrophy and chronic small vessel ischemic changes but no acute/subacute ischemic infarct, bleed or mass lesion.  She immediately started speech therapy.  Her symptoms have improved, although she still has some word-finding difficulty but mostly difficulty producing words.  It also takes her longer to read a paragraph.  She is taking ASA  daily, which she was taking prior to this event.  She uses a cane to ambulate but has not had any falls.  She feels steady on her feet with the cane.  She also has longstanding chronic dizziness.  PAST MEDICAL HISTORY: Past Medical History:  Diagnosis Date  . Arthritis   . CHF (congestive heart failure) (HCC)   . Diabetes mellitus without complication (HCC)   . Hypertension     PAST SURGICAL HISTORY: Past Surgical History:  Procedure Laterality Date  . ABDOMINAL HYSTERECTOMY    . BUNIONECTOMY    . CARPAL TUNNEL RELEASE    . EYE SURGERY    . KNEE ARTHROSCOPY    . ROTATOR CUFF REPAIR    . TONSILLECTOMY      MEDICATIONS: Current Outpatient Prescriptions on File Prior to Visit  Medication Sig Dispense Refill  . amLODipine (NORVASC) 5 MG tablet Take 5 mg by mouth daily.    Marland Kitchen aspirin EC 81 MG tablet Take 81 mg by mouth daily.    .  carvedilol (COREG) 6.25 MG tablet Take 6.25 mg by mouth 2 (two) times daily with a meal.    . losartan (COZAAR) 100 MG tablet Take 100 mg by mouth daily.    . Misc Natural Products (OSTEO BI-FLEX/5-LOXIN ADVANCED PO) Take 1 tablet by mouth 2 (two) times daily.     . mometasone (NASONEX) 50 MCG/ACT nasal spray Place 2 sprays into the nose daily.    . Multiple Vitamins-Minerals (MULTIVITAMIN ADULTS) TABS See admin instructions. Pack of vitamins:Take 1 pack by mouth once a day    . sitaGLIPtin (JANUVIA) 25 MG tablet Take 25 mg by mouth daily.     No current facility-administered medications on file prior to visit.     ALLERGIES: No Known Allergies  FAMILY HISTORY: History reviewed. No pertinent family history.  SOCIAL HISTORY: Social History   Social History  . Marital status: Married    Spouse name: N/A  . Number of children: N/A  . Years of education: N/A   Occupational History  . Not on file.   Social History Main Topics  . Smoking status: Never Smoker  . Smokeless tobacco: Never Used  . Alcohol use No  . Drug use: No  . Sexual activity: Not on file   Other Topics Concern  . Not on file   Social History Narrative  . No narrative on file    REVIEW OF SYSTEMS: Constitutional: No fevers, chills, or sweats, no generalized fatigue, change in appetite Eyes: No  visual changes, double vision, eye pain Ear, nose and throat: No hearing loss, ear pain, nasal congestion, sore throat Cardiovascular: No chest pain, palpitations Respiratory:  Some shortness of breath GastrointestinaI: No nausea, vomiting, diarrhea, abdominal pain, fecal incontinence Genitourinary:  No dysuria, urinary retention or frequency Musculoskeletal:  No neck pain, back pain Integumentary: No rash, pruritus, skin lesions Neurological: as above Psychiatric: No depression, insomnia, anxiety Endocrine: No palpitations, fatigue, diaphoresis, mood swings, change in appetite, change in weight, increased  thirst Hematologic/Lymphatic:  No purpura, petechiae. Allergic/Immunologic: no itchy/runny eyes, nasal congestion, recent allergic reactions, rashes  PHYSICAL EXAM: Vitals:   07/07/17 1001  BP: 138/60  Pulse: 63   General: No acute distress.  Patient appears well-groomed.  Head:  Normocephalic/atraumatic Eyes:  fundi examined but not visualized Neck: supple, no paraspinal tenderness, full range of motion Back: No paraspinal tenderness Heart: regular rate and rhythm Lungs: Clear to auscultation bilaterally. Vascular: No carotid bruits. Neurological Exam: Mental status: alert and oriented to person, place, and time, recent and remote memory intact, fund of knowledge intact, attention and concentration intact, speech mildly dysfluent, not dysarthric.  Follows 3 step command across midline but mild difficulty with repeating, naming fluency and reading.  Cranial nerves: CN I: not tested CN II: pupils equal, round and reactive to light, visual fields intact CN III, IV, VI:  full range of motion, no nystagmus, no ptosis CN V: facial sensation intact CN VII: upper and lower face symmetric CN VIII: hearing intact CN IX, X: gag intact, uvula midline CN XI: sternocleidomastoid and trapezius muscles intact CN XII: tongue midline Bulk & Tone: normal, no fasciculations. Motor:  5/5 throughout  Sensation: temperature and vibration sensation intact. Deep Tendon Reflexes:  2+ upper extremities, absent lower extremities, toes downgoing.  Finger to nose testing:  Without dysmetria.  Gait:  Cautious wide-based gait.  Able to turn but not tandem walk. Romberg negative.  IMPRESSION: Probable aphasia secondary to ischemic stroke.   Hypertension Diabetes  We discussed stroke workup.  After extensive discussion, we will limit workup to checking a lipid panel.  We will forego imaging of the intracranial and extracranial vasculature, as she would not want any surgical or other invasive procedure.  We  will forego 2D echo and holter monitor as she would not want to start anticoagulation.  We will also forego ordering an MRI of the brain since it will not change management.  PLAN: 1.  Check lipid panel.  LDL goal should be less than 70.  She previously was on Lipitor but stopped due to arthralgia.  Consider a lower potent statin, such as simvastatin. 2.  She will continue ASA 81mg  daily as studies have not shown any significant increased secondary stroke prevention when switching to ASA 325mg  or to Plavix.  I would be cautious with Plavix given her age and possible risk for falls.  However, I have no objection to increasing ASA to 325mg  daily. 3.  Continue speech therapy 4.  Continue blood pressure and glycemic control. 5.  Follow up in 3 months.  Thank you for allowing me to take part in the care of this patient.  Shon Millet, DO  CC:  Leilani Able, MD

## 2017-07-07 NOTE — Patient Instructions (Signed)
1.  We will check a lipid panel.  I will send the results to Dr. Pecola Leisure with recommendations regarding a cholesterol lowering medication. 2.  Remain on aspirin 81mg  daily.  However, you may increase dose to 325mg  daily. 3.  Continue speech therapy 4.  Follow up in 3 months.

## 2017-07-12 ENCOUNTER — Telehealth: Payer: Self-pay

## 2017-07-12 NOTE — Telephone Encounter (Signed)
Called and discussed cholesterol results with Pt, advsd her of Dr Rosanne Sack recommendation of strarting a cholesterol lowering medication, Pt to contact Dr Pecola Leisure

## 2017-07-12 NOTE — Telephone Encounter (Signed)
-----   Message from Drema Dallas, DO sent at 07/10/2017  7:16 AM EDT ----- Labs reveal elevated cholesterol of 306 with LDL (bad cholesterol) of 206.  If she is agreeable, I would ask Dr. Pecola Leisure about starting a cholesterol medication.

## 2017-08-09 ENCOUNTER — Encounter: Payer: Self-pay | Admitting: Family Medicine

## 2017-08-09 ENCOUNTER — Ambulatory Visit: Payer: Medicare Other | Admitting: Family Medicine

## 2017-08-09 ENCOUNTER — Other Ambulatory Visit: Payer: Self-pay

## 2017-08-09 VITALS — BP 144/62 | HR 67 | Temp 98.0°F | Resp 16 | Ht 63.5 in | Wt 163.2 lb

## 2017-08-09 DIAGNOSIS — Z23 Encounter for immunization: Secondary | ICD-10-CM | POA: Diagnosis not present

## 2017-08-09 DIAGNOSIS — H6121 Impacted cerumen, right ear: Secondary | ICD-10-CM

## 2017-08-09 DIAGNOSIS — F4321 Adjustment disorder with depressed mood: Secondary | ICD-10-CM

## 2017-08-09 MED ORDER — CARBAMIDE PEROXIDE 6.5 % OT SOLN: 5.0000 [drp] | Freq: Two times a day (BID) | OTIC | 0 refills | Status: DC

## 2017-08-09 NOTE — Progress Notes (Signed)
Chief Complaint  Patient presents with  . New Patient (Initial Visit)    right ear needs cleaning per pt and it itches    HPI  This is a patient of Dr. Pecola Leisure who was sent over by ENT for right ear cerumen impaction She reports that she is having difficulty hearing on the right despite hearing aids She denies pain in the ear She reports some dizziness and she is currently being evaluated for the dizziness to see if this is related to stroke  She also had slurred speech and difficulty getting her words out. She sees Neurology for this.  Depression screen [facility] 2/9 08/09/2017 04/18/2017 12/30/2016  Decreased Interest 2 0 1  Down, Depressed, Hopeless 1 1 1   PHQ - 2 Score 3 1 2   Altered sleeping 1 - 1  Tired, decreased energy 3 - 3  Change in appetite 0 - 2  Feeling bad or failure about yourself  0 - 0  Trouble concentrating 3 - 0  Moving slowly or fidgety/restless 3 - 0  Suicidal thoughts 0 - 0  PHQ-9 Score 13 - 8  Difficult doing work/chores Somewhat difficult - Not difficult at all   She is here with a caregiver who states that the patient and her PCP are working her up for stroke, cognitive changes and that the patient is getting upset by not being able to communicate like she should.    Past Medical History:  Diagnosis Date  . Arthritis   . CHF (congestive heart failure) (HCC)   . Diabetes mellitus without complication (HCC)   . Hypertension     Current Outpatient Medications  Medication Sig Dispense Refill  . amLODipine (NORVASC) 5 MG tablet Take 5 mg by mouth daily.    Marland Kitchen aspirin EC 81 MG tablet Take 81 mg by mouth daily.    . carvedilol (COREG) 6.25 MG tablet Take 6.25 mg by mouth 2 (two) times daily with a meal.    . losartan (COZAAR) 100 MG tablet Take 100 mg by mouth daily.    . Misc Natural Products (OSTEO BI-FLEX/5-LOXIN ADVANCED PO) Take 1 tablet by mouth 2 (two) times daily.     . mometasone (NASONEX) 50 MCG/ACT nasal spray Place 2 sprays into the nose daily.      . Multiple Vitamins-Minerals (MULTIVITAMIN ADULTS) TABS See admin instructions. Pack of vitamins:Take 1 pack by mouth once a day    . carbamide peroxide (DEBROX) 6.5 % OTIC solution Place 5 drops 2 (two) times daily into the right ear. 15 mL 0  . sitaGLIPtin (JANUVIA) 25 MG tablet Take 25 mg by mouth daily.     No current facility-administered medications for this visit.     Allergies: No Known Allergies  Past Surgical History:  Procedure Laterality Date  . ABDOMINAL HYSTERECTOMY    . BUNIONECTOMY    . CARPAL TUNNEL RELEASE    . EYE SURGERY    . KNEE ARTHROSCOPY    . ROTATOR CUFF REPAIR    . TONSILLECTOMY      Social History   Socioeconomic History  . Marital status: Married    Spouse name: None  . Number of children: None  . Years of education: None  . Highest education level: None  Social Needs  . Financial resource strain: None  . Food insecurity - worry: None  . Food insecurity - inability: None  . Transportation needs - medical: None  . Transportation needs - non-medical: None  Occupational History  . None  Tobacco Use  . Smoking status: Never Smoker  . Smokeless tobacco: Never Used  Substance and Sexual Activity  . Alcohol use: No  . Drug use: No  . Sexual activity: None  Other Topics Concern  . None  Social History Narrative  . None    No family history on file.   Review of Systems  Constitutional: Negative for chills and fever.  HENT: Positive for hearing loss. Negative for congestion, ear discharge, ear pain, sinus pain and tinnitus.   Respiratory: Negative for cough and hemoptysis.   Cardiovascular: Negative for chest pain and palpitations.  Gastrointestinal: Negative for abdominal pain, nausea and vomiting.  Psychiatric/Behavioral: Negative for depression. The patient is not nervous/anxious and does not have insomnia.       Objective: Vitals:   08/09/17 1124  BP: (!) 144/62  Pulse: 67  Resp: 16  Temp: 98 F (36.7 C)  TempSrc: Oral   SpO2: 99%  Weight: 163 lb 3.2 oz (74 kg)  Height: 5' 3.5" (1.613 m)    Physical Exam  Constitutional: She is oriented to person, place, and time. She appears well-developed and well-nourished.  HENT:  Head: Normocephalic and atraumatic.  Eyes: Conjunctivae and EOM are normal.  Cardiovascular: Normal rate, regular rhythm and normal heart sounds.  No murmur heard. Pulmonary/Chest: Effort normal and breath sounds normal. No stridor. No respiratory distress.  Neurological: She is alert and oriented to person, place, and time.  Skin: Skin is warm. Capillary refill takes less than 2 seconds.  Psychiatric: She has a normal mood and affect. Her behavior is normal. Judgment and thought content normal.    Bilateral hearing aids Right ear with cerumen impaction   Assessment and Plan Catherine Vazquez was seen today for new patient (initial visit).  Diagnoses and all orders for this visit:  Need for prophylactic vaccination and inoculation against influenza -     Flu Vaccine QUAD 36+ mos IM  Impacted cerumen of right ear- unresolved with ear lavage Given debrox Pt to return for repeat lavage -     Ear wax removal  Adjustment disorder with depressed mood- continue with PCP and Neurology  Need for diphtheria-tetanus-pertussis (Tdap) vaccine  Other orders  -     carbamide peroxide (DEBROX) 6.5 % OTIC solution; Place 5 drops 2 (two) times daily into the right ear. -     Tdap vaccine greater than or equal to 7yo IM     Ekta Dancer A [employer]

## 2017-08-09 NOTE — Patient Instructions (Addendum)
   IF you received an x-ray today, you will receive an invoice from [facility]. Please contact [facility] at [phone] with questions or concerns regarding your invoice.   IF you received labwork today, you will receive an invoice from [facility]. Please contact [facility] at [phone] with questions or concerns regarding your invoice.   Our billing staff will not be able to assist you with questions regarding bills from these companies.  You will be contacted with the lab results as soon as they are available. The fastest way to get your results is to activate your My Chart account. Instructions are located on the last page of this paperwork. If you have not heard from us regarding the results in 2 weeks, please contact this office.     Earwax Buildup, Adult The ears produce a substance called earwax that helps keep bacteria out of the ear and protects the skin in the ear canal. Occasionally, earwax can build up in the ear and cause discomfort or hearing loss. What increases the risk? This condition is more likely to develop in people who:  Are female.  Are elderly.  Naturally produce more earwax.  Clean their ears often with cotton swabs.  Use earplugs often.  Use in-ear headphones often.  Wear hearing aids.  Have narrow ear canals.  Have earwax that is overly thick or sticky.  Have eczema.  Are dehydrated.  Have excess hair in the ear canal.  What are the signs or symptoms? Symptoms of this condition include:  Reduced or muffled hearing.  A feeling of fullness in the ear or feeling that the ear is plugged.  Fluid coming from the ear.  Ear pain.  Ear itch.  Ringing in the ear.  Coughing.  An obvious piece of earwax that can be seen inside the ear canal.  How is this diagnosed? This condition may be diagnosed based on:  Your symptoms.  Your medical history.  An ear exam. During the exam, your health care provider will look  into your ear with an instrument called an otoscope.  You may have tests, including a hearing test. How is this treated? This condition may be treated by:  Using ear drops to soften the earwax.  Having the earwax removed by a health care provider. The health care provider may: ? Flush the ear with water. ? Use an instrument that has a loop on the end (curette). ? Use a suction device.  Surgery to remove the wax buildup. This may be done in severe cases.  Follow these instructions at home:  Take over-the-counter and prescription medicines only as told by your health care provider.  Do not put any objects, including cotton swabs, into your ear. You can clean the opening of your ear canal with a washcloth or facial tissue.  Follow instructions from your health care provider about cleaning your ears. Do not over-clean your ears.  Drink enough fluid to keep your urine clear or pale yellow. This will help to thin the earwax.  Keep all follow-up visits as told by your health care provider. If earwax builds up in your ears often or if you use hearing aids, consider seeing your health care provider for routine, preventive ear cleanings. Ask your health care provider how often you should schedule your cleanings.  If you have hearing aids, clean them according to instructions from the manufacturer and your health care provider. Contact a health care provider if:  You have ear pain.  You develop   a fever.  You have blood, pus, or other fluid coming from your ear.  You have hearing loss.  You have ringing in your ears that does not go away.  Your symptoms do not improve with treatment.  You feel like the room is spinning (vertigo). Summary  Earwax can build up in the ear and cause discomfort or hearing loss.  The most common symptoms of this condition include reduced or muffled hearing and a feeling of fullness in the ear or feeling that the ear is plugged.  This condition may be  diagnosed based on your symptoms, your medical history, and an ear exam.  This condition may be treated by using ear drops to soften the earwax or by having the earwax removed by a health care provider.  Do not put any objects, including cotton swabs, into your ear. You can clean the opening of your ear canal with a washcloth or facial tissue. This information is not intended to replace advice given to you by your health care provider. Make sure you discuss any questions you have with your health care provider. Document Released: 10/20/2004 Document Revised: 11/23/2016 Document Reviewed: 11/23/2016 Elsevier Interactive Patient Education  2018 [employer]  

## 2017-08-14 ENCOUNTER — Ambulatory Visit: Payer: Medicare Other | Admitting: Family Medicine

## 2017-08-14 VITALS — BP 156/78 | HR 65 | Temp 97.8°F | Resp 18 | Ht 63.5 in | Wt 166.0 lb

## 2017-08-14 DIAGNOSIS — H6121 Impacted cerumen, right ear: Secondary | ICD-10-CM

## 2017-08-14 NOTE — Patient Instructions (Addendum)
Earwax Buildup, Adult The ears produce a substance called earwax that helps keep bacteria out of the ear and protects the skin in the ear canal. Occasionally, earwax can build up in the ear and cause discomfort or hearing loss. What increases the risk? This condition is more likely to develop in people who:  Are female.  Are elderly.  Naturally produce more earwax.  Clean their ears often with cotton swabs.  Use earplugs often.  Use in-ear headphones often.  Wear hearing aids.  Have narrow ear canals.  Have earwax that is overly thick or sticky.  Have eczema.  Are dehydrated.  Have excess hair in the ear canal.  What are the signs or symptoms? Symptoms of this condition include:  Reduced or muffled hearing.  A feeling of fullness in the ear or feeling that the ear is plugged.  Fluid coming from the ear.  Ear pain.  Ear itch.  Ringing in the ear.  Coughing.  An obvious piece of earwax that can be seen inside the ear canal.  How is this diagnosed? This condition may be diagnosed based on:  Your symptoms.  Your medical history.  An ear exam. During the exam, your health care provider will look into your ear with an instrument called an otoscope.  You may have tests, including a hearing test. How is this treated? This condition may be treated by:  Using ear drops to soften the earwax.  Having the earwax removed by a health care provider. The health care provider may: ? Flush the ear with water. ? Use an instrument that has a loop on the end (curette). ? Use a suction device.  Surgery to remove the wax buildup. This may be done in severe cases.  Follow these instructions at home:  Take over-the-counter and prescription medicines only as told by your health care provider.  Do not put any objects, including cotton swabs, into your ear. You can clean the opening of your ear canal with a washcloth or facial tissue.  Follow instructions from your health  care provider about cleaning your ears. Do not over-clean your ears.  Drink enough fluid to keep your urine clear or pale yellow. This will help to thin the earwax.  Keep all follow-up visits as told by your health care provider. If earwax builds up in your ears often or if you use hearing aids, consider seeing your health care provider for routine, preventive ear cleanings. Ask your health care provider how often you should schedule your cleanings.  If you have hearing aids, clean them according to instructions from the manufacturer and your health care provider. Contact a health care provider if:  You have ear pain.  You develop a fever.  You have blood, pus, or other fluid coming from your ear.  You have hearing loss.  You have ringing in your ears that does not go away.  Your symptoms do not improve with treatment.  You feel like the room is spinning (vertigo). Summary  Earwax can build up in the ear and cause discomfort or hearing loss.  The most common symptoms of this condition include reduced or muffled hearing and a feeling of fullness in the ear or feeling that the ear is plugged.  This condition may be diagnosed based on your symptoms, your medical history, and an ear exam.  This condition may be treated by using ear drops to soften the earwax or by having the earwax removed by a health care provider.  Do   not put any objects, including cotton swabs, into your ear. You can clean the opening of your ear canal with a washcloth or facial tissue. This information is not intended to replace advice given to you by your health care provider. Make sure you discuss any questions you have with your health care provider. Document Released: 10/20/2004 Document Revised: 11/23/2016 Document Reviewed: 11/23/2016 Elsevier Interactive Patient Education  2018 [employer].     IF you received an x-ray today, you will receive an invoice from [facility]. Please contact  [facility] at [phone] with questions or concerns regarding your invoice.   IF you received labwork today, you will receive an invoice from [place]. Please contact [facility] at [phone] with questions or concerns regarding your invoice.   Our billing staff will not be able to assist you with questions regarding bills from these companies.  You will be contacted with the lab results as soon as they are available. The fastest way to get your results is to activate your My Chart account. Instructions are located on the last page of this paperwork. If you have not heard from [place] regarding the results in 2 weeks, please contact this office.      Earwax Buildup, Adult The ears produce a substance called earwax that helps keep bacteria out of the ear and protects the skin in the ear canal. Occasionally, earwax can build up in the ear and cause discomfort or hearing loss. What increases the risk? This condition is more likely to develop in people who:  Are female.  Are elderly.  Naturally produce more earwax.  Clean their ears often with cotton swabs.  Use earplugs often.  Use in-ear headphones often.  Wear hearing aids.  Have narrow ear canals.  Have earwax that is overly thick or sticky.  Have eczema.  Are dehydrated.  Have excess hair in the ear canal.  What are the signs or symptoms? Symptoms of this condition include:  Reduced or muffled hearing.  A feeling of fullness in the ear or feeling that the ear is plugged.  Fluid coming from the ear.  Ear pain.  Ear itch.  Ringing in the ear.  Coughing.  An obvious piece of earwax that can be seen inside the ear canal.  How is this diagnosed? This condition may be diagnosed based on:  Your symptoms.  Your medical history.  An ear exam. During the exam, your health care provider will look into your ear with an instrument called an otoscope.  You may have tests, including a hearing test. How is  this treated? This condition may be treated by:  Using ear drops to soften the earwax.  Having the earwax removed by a health care provider. The health care provider may: ? Flush the ear with water. ? Use an instrument that has a loop on the end (curette). ? Use a suction device.  Surgery to remove the wax buildup. This may be done in severe cases.  Follow these instructions at home:  Take over-the-counter and prescription medicines only as told by your health care provider.  Do not put any objects, including cotton swabs, into your ear. You can clean the opening of your ear canal with a washcloth or facial tissue.  Follow instructions from your health care provider about cleaning your ears. Do not over-clean your ears.  Drink enough fluid to keep your urine clear or pale yellow. This will help to thin the earwax.  Keep all follow-up visits as told by  your health care provider. If earwax builds up in your ears often or if you use hearing aids, consider seeing your health care provider for routine, preventive ear cleanings. Ask your health care provider how often you should schedule your cleanings.  If you have hearing aids, clean them according to instructions from the manufacturer and your health care provider. Contact a health care provider if:  You have ear pain.  You develop a fever.  You have blood, pus, or other fluid coming from your ear.  You have hearing loss.  You have ringing in your ears that does not go away.  Your symptoms do not improve with treatment.  You feel like the room is spinning (vertigo). Summary  Earwax can build up in the ear and cause discomfort or hearing loss.  The most common symptoms of this condition include reduced or muffled hearing and a feeling of fullness in the ear or feeling that the ear is plugged.  This condition may be diagnosed based on your symptoms, your medical history, and an ear exam.  This condition may be treated by  using ear drops to soften the earwax or by having the earwax removed by a health care provider.  Do not put any objects, including cotton swabs, into your ear. You can clean the opening of your ear canal with a washcloth or facial tissue. This information is not intended to replace advice given to you by your health care provider. Make sure you discuss any questions you have with your health care provider. Document Released: 10/20/2004 Document Revised: 11/23/2016 Document Reviewed: 11/23/2016 Elsevier Interactive Patient Education  [employer]2018 Elsevier Inc.

## 2017-08-14 NOTE — Progress Notes (Signed)
Chief Complaint  Patient presents with  . Cerumen Impaction    right ear    HPI  Pt is here for ear lavage Catherine Vazquez was not able to get the debrox that was prescribed from the pharmacy so Catherine Vazquez used an otc version Catherine Vazquez reports that nothing has run out and the ear still feels blocked    Past Medical History:  Diagnosis Date  . Arthritis   . CHF (congestive heart failure) (HCC)   . Diabetes mellitus without complication (HCC)   . Hypertension     Current Outpatient Medications  Medication Sig Dispense Refill  . amLODipine (NORVASC) 5 MG tablet Take 5 mg by mouth daily.    Marland Kitchen aspirin EC 81 MG tablet Take 81 mg by mouth daily.    . carvedilol (COREG) 6.25 MG tablet Take 6.25 mg by mouth 2 (two) times daily with a meal.    . losartan (COZAAR) 100 MG tablet Take 100 mg by mouth daily.    . Misc Natural Products (OSTEO BI-FLEX/5-LOXIN ADVANCED PO) Take 1 tablet by mouth 2 (two) times daily.     . Multiple Vitamins-Minerals (MULTIVITAMIN ADULTS) TABS See admin instructions. Pack of vitamins:Take 1 pack by mouth once a day     No current facility-administered medications for this visit.     Allergies: No Known Allergies  Past Surgical History:  Procedure Laterality Date  . ABDOMINAL HYSTERECTOMY    . BUNIONECTOMY    . CARPAL TUNNEL RELEASE    . EYE SURGERY    . KNEE ARTHROSCOPY    . ROTATOR CUFF REPAIR    . TONSILLECTOMY      Social History   Socioeconomic History  . Marital status: Married    Spouse name: Not on file  . Number of children: Not on file  . Years of education: Not on file  . Highest education level: Not on file  Social Needs  . Financial resource strain: Not on file  . Food insecurity - worry: Not on file  . Food insecurity - inability: Not on file  . Transportation needs - medical: Not on file  . Transportation needs - non-medical: Not on file  Occupational History  . Not on file  Tobacco Use  . Smoking status: Never Smoker  . Smokeless tobacco: Never  Used  Substance and Sexual Activity  . Alcohol use: No  . Drug use: No  . Sexual activity: Not on file  Other Topics Concern  . Not on file  Social History Narrative  . Not on file    No family history on file.   ROS Review of Systems See HPI Constitution: No fevers or chills No malaise No diaphoresis Skin: No rash or itching Eyes: no blurry vision, no double vision GU: no dysuria or hematuria Neuro: no dizziness or headaches    Objective: Vitals:   08/14/17 1024  BP: (!) 156/78  Pulse: 65  Resp: 18  Temp: 97.8 F (36.6 C)  TempSrc: Oral  SpO2: 99%  Weight: 166 lb (75.3 kg)  Height: 5' 3.5" (1.613 m)    Physical Exam  Constitutional: Catherine Vazquez appears well-developed and well-nourished.  HENT:  Head: Normocephalic and atraumatic.  Left Ear: External ear normal.  Eyes: Conjunctivae and EOM are normal.   Right ear impacted with cerumen  Assessment and Plan Catherine Vazquez was seen today for cerumen impaction.  Diagnoses and all orders for this visit:  Impacted cerumen of right ear- resolved with lavage -     Ear wax  removal     Aubrey Voong A Keylin Podolsky

## 2017-09-29 ENCOUNTER — Ambulatory Visit: Payer: Medicare Other | Admitting: Neurology

## 2017-10-13 ENCOUNTER — Ambulatory Visit: Payer: Medicare Other | Admitting: Neurology

## 2017-10-13 ENCOUNTER — Encounter: Payer: Self-pay | Admitting: Neurology

## 2017-10-13 VITALS — BP 146/62 | HR 68 | Ht 65.0 in | Wt 164.5 lb

## 2017-10-13 DIAGNOSIS — I639 Cerebral infarction, unspecified: Secondary | ICD-10-CM

## 2017-10-13 DIAGNOSIS — Z794 Long term (current) use of insulin: Secondary | ICD-10-CM | POA: Diagnosis not present

## 2017-10-13 DIAGNOSIS — E785 Hyperlipidemia, unspecified: Secondary | ICD-10-CM | POA: Diagnosis not present

## 2017-10-13 DIAGNOSIS — I10 Essential (primary) hypertension: Secondary | ICD-10-CM | POA: Diagnosis not present

## 2017-10-13 DIAGNOSIS — E119 Type 2 diabetes mellitus without complications: Secondary | ICD-10-CM

## 2017-10-13 NOTE — Progress Notes (Signed)
NEUROLOGY FOLLOW UP OFFICE NOTE  Catherine Vazquez [id number]  HISTORY OF PRESENT ILLNESS: Catherine Vazquez is a 84 year old right-handed female with hypertension, CHF and diabetes who follows up for stroke.  She is accompanied by her daughter who supplements history.  UPDATE: She is taking ASA 81mg  daily.  LDL was 206.  She was started on pravastatin yesterday by her PCP.  She is doing well.  No new events.   HISTORY: During the week after Labor Day, Catherine Vazquez exhibited slurred speech with some word-finding difficulty but mostly difficulty producing words.  She did not exhibit facial droop, visual disturbance, ataxia or unilateral numbness or weakness.  CT of head without contrast performed on 06/01/17 was personally reviewed and revealed atrophy and chronic small vessel ischemic changes but no acute/subacute ischemic infarct, bleed or mass lesion.  She immediately started speech therapy.  Her symptoms have improved, although she still has some word-finding difficulty but mostly difficulty producing words.  It also takes her longer to read a paragraph.  She is taking ASA 81mg  daily, which she was taking prior to this event.  She uses a cane to ambulate but has not had any falls.  She feels steady on her feet with the cane.  She also has longstanding chronic dizziness.  PAST MEDICAL HISTORY: Past Medical History:  Diagnosis Date  . Arthritis   . CHF (congestive heart failure) (HCC)   . Diabetes mellitus without complication (HCC)   . Hypertension     MEDICATIONS: Current Outpatient Medications on File Prior to Visit  Medication Sig Dispense Refill  . Bioflavonoid Products (BIOFLEX PO) Take 1 tablet by mouth.    . cyanocobalamin 1000 MCG tablet Take 1,000 mcg by mouth daily.    Marland Kitchen amLODipine (NORVASC) 5 MG tablet Take 5 mg by mouth daily.    Marland Kitchen aspirin EC 81 MG tablet Take 81 mg by mouth daily.    . carvedilol (COREG) 6.25 MG tablet Take 6.25 mg by mouth 2 (two) times daily with a meal.     . losartan (COZAAR) 100 MG tablet Take 100 mg by mouth daily.    . Misc Natural Products (OSTEO BI-FLEX/5-LOXIN ADVANCED PO) Take 1 tablet by mouth 2 (two) times daily.     . Multiple Vitamins-Minerals (MULTIVITAMIN ADULTS) TABS See admin instructions. Pack of vitamins:Take 1 pack by mouth once a day     No current facility-administered medications on file prior to visit.     ALLERGIES: No Known Allergies  FAMILY HISTORY: No family history on file.  SOCIAL HISTORY: Social History   Socioeconomic History  . Marital status: Married    Spouse name: Not on file  . Number of children: Not on file  . Years of education: Not on file  . Highest education level: Not on file  Social Needs  . Financial resource strain: Not on file  . Food insecurity - worry: Not on file  . Food insecurity - inability: Not on file  . Transportation needs - medical: Not on file  . Transportation needs - non-medical: Not on file  Occupational History  . Not on file  Tobacco Use  . Smoking status: Never Smoker  . Smokeless tobacco: Never Used  Substance and Sexual Activity  . Alcohol use: No  . Drug use: No  . Sexual activity: Not on file  Other Topics Concern  . Not on file  Social History Narrative  . Not on file    REVIEW OF SYSTEMS:  Constitutional: No fevers, chills, or sweats, no generalized fatigue, change in appetite Eyes: No visual changes, double vision, eye pain Ear, nose and throat: No hearing loss, ear pain, nasal congestion, sore throat Cardiovascular: No chest pain, palpitations Respiratory:  No shortness of breath at rest or with exertion, wheezes GastrointestinaI: No nausea, vomiting, diarrhea, abdominal pain, fecal incontinence Genitourinary:  No dysuria, urinary retention or frequency Musculoskeletal:  No neck pain, back pain Integumentary: No rash, pruritus, skin lesions Neurological: as above Psychiatric: No depression, insomnia, anxiety Endocrine: No palpitations,  fatigue, diaphoresis, mood swings, change in appetite, change in weight, increased thirst Hematologic/Lymphatic:  No purpura, petechiae. Allergic/Immunologic: no itchy/runny eyes, nasal congestion, recent allergic reactions, rashes  PHYSICAL EXAM: Vitals:   10/13/17 1135  BP: (!) 146/62  Pulse: 68  SpO2: 97%   General: No acute distress.  Patient appears well-groomed.  Head:  Normocephalic/atraumatic Eyes:  Fundi examined but not visualized Neck: supple, no paraspinal tenderness, full range of motion Heart:  Regular rate and rhythm Lungs:  Clear to auscultation bilaterally Back: No paraspinal tenderness Neurological Exam: alert and oriented to person, place, and time. Attention span and concentration intact, recent and remote memory intact, fund of knowledge intact.  Speech fluent and not dysarthric, language intact.  CN II-XII intact. Bulk and tone normal, muscle strength 5/5 throughout.  Sensation to light touch  intact.  Deep tendon reflexes 1+ upper extremities, absent lower extremities.  Finger to nose testing intact.  Cautious wide-based gait. Romberg negative.  IMPRESSION: Probable aphasia secondary to ischemic stroke.   Hypertension Diabetes Hyperlipidemia  PLAN: 1.  Continue ASA 81mg  daily for secondary stroke prevention 2.  Continue statin therapy 3.  Continue medication management for glycemic control. 4.  Optimize blood pressure control with your PCP. 5.  Follow up in 6 months.  18 minutes spent face to face with patient, over 50% spent discussing management.  Shon Millet, DO  CC:  Leilani Able, MD

## 2017-10-13 NOTE — Patient Instructions (Signed)
1.  Continue aspirin 2.  Start the statin medication 3.  Try to work on blood pressure control 4.  Follow up in 6 months.

## 2018-01-12 ENCOUNTER — Encounter (HOSPITAL_COMMUNITY): Payer: Self-pay | Admitting: Emergency Medicine

## 2018-01-12 ENCOUNTER — Emergency Department (HOSPITAL_COMMUNITY)
Admission: EM | Admit: 2018-01-12 | Discharge: 2018-01-12 | Disposition: A | Discharge status: Home / Self Care | Payer: Medicare Other | Attending: Emergency Medicine | Admitting: Emergency Medicine

## 2018-01-12 ENCOUNTER — Emergency Department (HOSPITAL_COMMUNITY): Payer: Medicare Other

## 2018-01-12 ENCOUNTER — Other Ambulatory Visit: Payer: Self-pay

## 2018-01-12 DIAGNOSIS — E119 Type 2 diabetes mellitus without complications: Secondary | ICD-10-CM | POA: Insufficient documentation

## 2018-01-12 DIAGNOSIS — Z79899 Other long term (current) drug therapy: Secondary | ICD-10-CM | POA: Insufficient documentation

## 2018-01-12 DIAGNOSIS — N200 Calculus of kidney: Secondary | ICD-10-CM | POA: Insufficient documentation

## 2018-01-12 DIAGNOSIS — I501 Left ventricular failure: Secondary | ICD-10-CM | POA: Insufficient documentation

## 2018-01-12 DIAGNOSIS — Z7982 Long term (current) use of aspirin: Secondary | ICD-10-CM | POA: Insufficient documentation

## 2018-01-12 DIAGNOSIS — I110 Hypertensive heart disease with heart failure: Secondary | ICD-10-CM | POA: Diagnosis not present

## 2018-01-12 DIAGNOSIS — R1030 Lower abdominal pain, unspecified: Secondary | ICD-10-CM | POA: Diagnosis present

## 2018-01-12 DIAGNOSIS — R112 Nausea with vomiting, unspecified: Secondary | ICD-10-CM

## 2018-01-12 LAB — URINALYSIS, ROUTINE W REFLEX MICROSCOPIC
Bilirubin Urine: NEGATIVE
Glucose, UA: NEGATIVE mg/dL
Ketones, ur: 5 mg/dL — AB
Leukocytes, UA: NEGATIVE
Nitrite: NEGATIVE
PROTEIN: 30 mg/dL — AB
Specific Gravity, Urine: 1.033 — ABNORMAL HIGH (ref 1.005–1.030)
pH: 6 (ref 5.0–8.0)

## 2018-01-12 LAB — COMPREHENSIVE METABOLIC PANEL
ALBUMIN: 3.7 g/dL (ref 3.5–5.0)
ALK PHOS: 71 U/L (ref 38–126)
ALT: 15 U/L (ref 14–54)
AST: 22 U/L (ref 15–41)
Anion gap: 11 (ref 5–15)
BUN: 13 mg/dL (ref 6–20)
CALCIUM: 9.4 mg/dL (ref 8.9–10.3)
CO2: 25 mmol/L (ref 22–32)
CREATININE: 0.99 mg/dL (ref 0.44–1.00)
Chloride: 105 mmol/L (ref 101–111)
GFR calc Af Amer: 56 mL/min — ABNORMAL LOW (ref >60)
GFR calc non Af Amer: 49 mL/min — ABNORMAL LOW (ref >60)
GLUCOSE: 164 mg/dL — AB (ref 65–99)
Potassium: 3.8 mmol/L (ref 3.5–5.1)
SODIUM: 141 mmol/L (ref 135–145)
Total Bilirubin: 0.8 mg/dL (ref 0.3–1.2)
Total Protein: 7.1 g/dL (ref 6.5–8.1)

## 2018-01-12 LAB — CBC
HCT: 39.4 % (ref 36.0–46.0)
HEMOGLOBIN: 13.1 g/dL (ref 12.0–15.0)
MCH: 32 pg (ref 26.0–34.0)
MCHC: 33.2 g/dL (ref 30.0–36.0)
MCV: 96.1 fL (ref 78.0–100.0)
PLATELETS: 260 10*3/uL (ref 150–400)
RBC: 4.1 MIL/uL (ref 3.87–5.11)
RDW: 13.4 % (ref 11.5–15.5)
WBC: 8.4 10*3/uL (ref 4.0–10.5)

## 2018-01-12 LAB — TROPONIN I: Troponin I: <0.03 ng/mL (ref <0.03)

## 2018-01-12 LAB — LIPASE, BLOOD: Lipase: 29 U/L (ref 11–51)

## 2018-01-12 MED ORDER — ACETAMINOPHEN-CODEINE #3 300-30 MG PO TABS: 1.0000 | ORAL_TABLET | Freq: Four times a day (QID) | ORAL | 0 refills | Status: DC | PRN

## 2018-01-12 MED ORDER — IOPAMIDOL (ISOVUE-300) INJECTION 61%
50.0000 mL | Freq: Once | INTRAVENOUS | Status: AC | PRN
  Administered 2018-01-12: 50 mL via INTRAVENOUS

## 2018-01-12 MED ORDER — ONDANSETRON HCL 4 MG/2ML IJ SOLN
4.0000 mg | Freq: Once | INTRAMUSCULAR | Status: AC
  Administered 2018-01-12: 4 mg via INTRAVENOUS
  Filled 2018-01-12: qty 2

## 2018-01-12 MED ORDER — SODIUM CHLORIDE 0.9 % IV BOLUS
500.0000 mL | Freq: Once | INTRAVENOUS | Status: AC
  Administered 2018-01-12: 500 mL via INTRAVENOUS

## 2018-01-12 MED ORDER — IOPAMIDOL (ISOVUE-300) INJECTION 61%
INTRAVENOUS | Status: AC
  Filled 2018-01-12: qty 100

## 2018-01-12 MED ORDER — TAMSULOSIN HCL 0.4 MG PO CAPS: 0.4000 mg | ORAL_CAPSULE | Freq: Every day | ORAL | 0 refills | Status: AC

## 2018-01-12 NOTE — Discharge Instructions (Addendum)
You have been seen in the Emergency Department (ED) today for pain that we believe based on your workup, is caused by kidney stones.  As we have discussed, please drink plenty of fluids.  Please make a follow up appointment with the physician(s) listed elsewhere in this documentation.  You may take pain medication as needed but ONLY as prescribed.  Please also take your prescribed Flomax daily.  We also recommend that you take over-the-counter ibuprofen regularly according to label instructions over the next 5 days.  Take it with meals to minimize stomach discomfort.  Please see your doctor as soon as possible as stones may take 1-3 weeks to pass and you may require additional care or medications.  Do not drink alcohol, drive or participate in any other potentially dangerous activities while taking opiate pain medication as it may make you sleepy. Do not take this medication with any other sedating medications, either prescription or over-the-counter. If you were prescribed Percocet or Vicodin, do not take these with acetaminophen (Tylenol) as it is already contained within these medications.   Take Tylenol #3 as needed for severe pain.  This medication is an opiate (or narcotic) pain medication and can be habit forming.  Use it as little as possible to achieve adequate pain control.  Do not use or use it with extreme caution if you have a history of opiate abuse or dependence.  If you are on a pain contract with your primary care doctor or a pain specialist, be sure to let them know you were prescribed this medication today from the Emergency Department.  This medication is intended for your use only - do not give any to anyone else and keep it in a secure place where nobody else, especially children, have access to it.  It will also cause or worsen constipation, so you may want to consider taking an over-the-counter stool softener while you are taking this medication.  Return to the Emergency Department  (ED) or call your doctor if you have any worsening pain, fever, painful urination, are unable to urinate, or develop other symptoms that concern you.    Kidney Stones Kidney stones (urolithiasis) are deposits that form inside your kidneys. The intense pain is caused by the stone moving through the urinary tract. When the stone moves, the ureter goes into spasm around the stone. The stone is usually passed in the urine.  CAUSES  A disorder that makes certain neck glands produce too much parathyroid hormone (primary hyperparathyroidism). A buildup of uric acid crystals, similar to gout in your joints. Narrowing (stricture) of the ureter. A kidney obstruction present at birth (congenital obstruction). Previous surgery on the kidney or ureters. Numerous kidney infections. SYMPTOMS  Feeling sick to your stomach (nauseous). Throwing up (vomiting). Blood in the urine (hematuria). Pain that usually spreads (radiates) to the groin. Frequency or urgency of urination. DIAGNOSIS  Taking a history and physical exam. Blood or urine tests. CT scan. Occasionally, an examination of the inside of the urinary bladder (cystoscopy) is performed. TREATMENT  Observation. Increasing your fluid intake. Extracorporeal shock wave lithotripsy--This is a noninvasive procedure that uses shock waves to break up kidney stones. Surgery may be needed if you have severe pain or persistent obstruction. There are various surgical procedures. Most of the procedures are performed with the use of small instruments. Only small incisions are needed to accommodate these instruments, so recovery time is minimized. The size, location, and chemical composition are all important variables that will determine the  proper choice of action for you. Talk to your health care provider to better understand your situation so that you will minimize the risk of injury to yourself and your kidney.  HOME CARE INSTRUCTIONS  Drink enough water  and fluids to keep your urine clear or pale yellow. This will help you to pass the stone or stone fragments. Strain all urine through the provided strainer. Keep all particulate matter and stones for your health care provider to see. The stone causing the pain may be as small as a grain of salt. It is very important to use the strainer each and every time you pass your urine. The collection of your stone will allow your health care provider to analyze it and verify that a stone has actually passed. The stone analysis will often identify what you can do to reduce the incidence of recurrences. Only take over-the-counter or prescription medicines for pain, discomfort, or fever as directed by your health care provider. Keep all follow-up visits as told by your health care provider. This is important. Get follow-up X-rays if required. The absence of pain does not always mean that the stone has passed. It may have only stopped moving. If the urine remains completely obstructed, it can cause loss of kidney function or even complete destruction of the kidney. It is your responsibility to make sure X-rays and follow-ups are completed. Ultrasounds of the kidney can show blockages and the status of the kidney. Ultrasounds are not associated with any radiation and can be performed easily in a matter of minutes. Make changes to your daily diet as told by your health care provider. You may be told to: Limit the amount of salt that you eat. Eat 5 or more servings of fruits and vegetables each day. Limit the amount of meat, poultry, fish, and eggs that you eat. Collect a 24-hour urine sample as told by your health care provider. You may need to collect another urine sample every 6-12 months. SEEK MEDICAL CARE IF: You experience pain that is progressive and unresponsive to any pain medicine you have been prescribed. SEEK IMMEDIATE MEDICAL CARE IF:  Pain cannot be controlled with the prescribed medicine. You have a  fever or shaking chills. The severity or intensity of pain increases over 18 hours and is not relieved by pain medicine. You develop a new onset of abdominal pain. You feel faint or pass out. You are unable to urinate.   This information is not intended to replace advice given to you by your health care provider. Make sure you discuss any questions you have with your health care provider.   Document Released: 09/12/2005 Document Revised: 06/03/2015 Document Reviewed: 02/13/2013 Elsevier Interactive Patient Education [employer]

## 2018-01-12 NOTE — ED Provider Notes (Signed)
Emergency Department Provider Note   I have reviewed the triage vital signs and the nursing notes.   HISTORY  Chief Complaint Abdominal Pain; Emesis; and Nausea   HPI Catherine Vazquez is a 84 y.o. female with PMH of CHF, DM, and HTN presents to the emergency department for evaluation of lower abdominal pain with vomiting.  Symptoms began yesterday evening.  She had vomiting throughout the night with lower abdominal discomfort.  No unilateral abdominal pain.  Denies any dysuria, hesitancy, urgency.  She has had some mild constipation which is not new or worse than normal.  No associated diarrhea.  No fevers, chills, sick contacts.  The patient has had one episode of emesis in the waiting room today he continues to have lower abdominal cramping pain.  She lives in independent care facility. No modifying factors.    Past Medical History:  Diagnosis Date  . Arthritis   . CHF (congestive heart failure) (HCC)   . Diabetes mellitus without complication (HCC)   . Hypertension     Patient Active Problem List   Diagnosis Date Noted  . CHF with left ventricular diastolic dysfunction, NYHA class 2 (HCC) 09/13/2013  . DOE (dyspnea on exertion) 09/12/2013  . Fatigue 09/12/2013  . HBP (high blood pressure) 09/12/2013  . Arthritis     Past Surgical History:  Procedure Laterality Date  . ABDOMINAL HYSTERECTOMY    . BUNIONECTOMY    . CARPAL TUNNEL RELEASE    . EYE SURGERY    . KNEE ARTHROSCOPY    . ROTATOR CUFF REPAIR    . TONSILLECTOMY      Current Outpatient Rx  . Order #: [id number] Class: Historical Med  . Order #: [id number] Class: Historical Med  . Order #: [id number] Class: Historical Med  . Order #: [id number] Class: Historical Med  . Order #: [id number] Class: Historical Med  . Order #: [id number] Class: Historical Med  . Order #: [id number] Class: Historical Med  . Order #: [id number] Class: Print  . Order #: [id number] Class: Historical Med  . Order #: [id number] Class: Print     Allergies Patient has no known allergies.  No family history on file.  Social History Social History   Tobacco Use  . Smoking status: Never Smoker  . Smokeless tobacco: Never Used  Substance Use Topics  . Alcohol use: No  . Drug use: No    Review of Systems  Constitutional: No fever/chills Eyes: No visual changes. ENT: No sore throat. Cardiovascular: Denies chest pain. Respiratory: Denies shortness of breath. Gastrointestinal: Positive lower abdominal pain. Positive nausea and vomiting.  No diarrhea.  No constipation. Genitourinary: Negative for dysuria. Musculoskeletal: Negative for back pain. Skin: Negative for rash. Neurological: Negative for headaches, focal weakness or numbness.  10-point ROS otherwise negative.  ____________________________________________   PHYSICAL EXAM:  VITAL SIGNS: ED Triage Vitals  Enc Vitals Group     BP 01/12/18 1208 (!) 181/84     Pulse Rate 01/12/18 1208 (!) 57     Resp 01/12/18 1208 16     Temp 01/12/18 1208 98.1 F (36.7 C)     Temp Source 01/12/18 1208 Oral     SpO2 01/12/18 1208 100 %     Pain Score 01/12/18 1211 8   Constitutional: Alert and oriented. Well appearing and in no acute distress. Eyes: Conjunctivae are normal.  Head: Atraumatic. Nose: No congestion/rhinnorhea. Mouth/Throat: Mucous membranes are moist.  Neck: No stridor.   Cardiovascular: Bradycardia. Good peripheral circulation. Grossly normal heart sounds.   Respiratory: Normal respiratory effort.  No retractions. Lungs CTAB. Gastrointestinal: Soft and nontender. No distention.  Musculoskeletal: No lower extremity tenderness nor edema. No gross deformities of extremities. Neurologic:  Normal speech and language. No gross focal neurologic deficits are appreciated.  Skin:  Skin is warm, dry and intact. No rash noted.  ____________________________________________   LABS (all labs ordered are listed, but only abnormal results are displayed)  Labs  Reviewed  COMPREHENSIVE METABOLIC PANEL - Abnormal; Notable for the following components:      Result Value   Glucose, Bld 164 (*)    GFR calc non Af Amer 49 (*)    GFR calc Af Amer 56 (*)    All other components within normal limits  URINALYSIS, ROUTINE W REFLEX MICROSCOPIC - Abnormal; Notable for the following components:   APPearance HAZY (*)    Specific Gravity, Urine 1.033 (*)    Hgb urine dipstick SMALL (*)    Ketones, ur 5 (*)    Protein, ur 30 (*)    Bacteria, UA FEW (*)    Squamous Epithelial / LPF 0-5 (*)    All other components within normal limits  LIPASE, BLOOD  CBC  TROPONIN I   ____________________________________________  EKG   EKG Interpretation  Date/Time:  Friday January 12 2018 21:21:10 EDT Ventricular Rate:  57 PR Interval:    QRS Duration: 135 QT Interval:  466 QTC Calculation: 454 R Axis:   -3 Text Interpretation:  Sinus or ectopic atrial rhythm Prolonged PR interval Right bundle branch block No STEMI.  Similar to prior.  Confirmed by Alona Bene [phone]) on 01/12/2018 9:37:41 PM       ____________________________________________  RADIOLOGY  Ct Abdomen Pelvis W Contrast  Result Date: 01/12/2018 CLINICAL DATA:  Nausea, vomiting and constipation. EXAM: CT ABDOMEN AND PELVIS WITH CONTRAST TECHNIQUE: Multidetector CT imaging of the abdomen and pelvis was performed using the standard protocol following bolus administration of intravenous contrast. CONTRAST:  45mL ISOVUE-300 IOPAMIDOL (ISOVUE-300) INJECTION 61% COMPARISON:  None. FINDINGS: LOWER CHEST: No basilar pulmonary nodules or pleural effusion. No apical pericardial effusion. HEPATOBILIARY: Normal hepatic contours and density. No intra- or extrahepatic biliary dilatation. Normal gallbladder. PANCREAS: Normal parenchymal contours without ductal dilatation. No peripancreatic fluid collection. SPLEEN: Normal. ADRENALS/URINARY TRACT: --Adrenal glands: Normal. --Right kidney/ureter: There is severe right  hydroureteronephrosis and mild perinephric and periureteral stranding secondary to obstructive uropathy caused by a stone at the right ureterovesical junction that measures 11 x 6 mm. --Left kidney/ureter: No hydronephrosis, nephroureterolithiasis, perinephric stranding or solid renal mass. --Urinary bladder: Normal for degree of distention STOMACH/BOWEL: --Stomach/Duodenum: No hiatal hernia or other gastric abnormality. Normal duodenal course. --Small bowel: No dilatation or inflammation. --Colon: No focal abnormality. --Appendix: Not visualized. No right lower quadrant inflammation or free fluid. VASCULAR/LYMPHATIC: Atherosclerotic calcification is present within the non-aneurysmal abdominal aorta, without hemodynamically significant stenosis. The portal vein, splenic vein, superior mesenteric vein and IVC are patent. No abdominal or pelvic lymphadenopathy. REPRODUCTIVE: Status post hysterectomy. No adnexal mass. MUSCULOSKELETAL. Multilevel degenerative disc disease and facet arthrosis. No bony spinal canal stenosis. OTHER: None. IMPRESSION: 1. Severe right-sided obstructive uropathy secondary to 11 x 6 right ureterovesical junction stone causing severe right hydroureteronephrosis. 2. No other nephroureterolithiasis. 3.  Aortic Atherosclerosis (ICD10-I70.0). Electronically Signed   By: Deatra Robinson M.D.   On: 01/12/2018 21:36    ____________________________________________   PROCEDURES  Procedure(s) performed:   Procedures  None ____________________________________________   INITIAL IMPRESSION / ASSESSMENT AND PLAN / ED COURSE  Pertinent labs & imaging results that were available  during my care of the patient were reviewed by me and considered in my medical decision making (see chart for details).  Patient presents to the emergency department for evaluation of lower abdominal pain with nausea and vomiting.  She has mild lower abdominal tenderness.  Labs from triage reviewed with no acute  findings.  UA pending.  I did add a troponin but doubt atypical ACS.  Symptoms are more typical for viral infection with possible gastritis.  Plan for CT abdomen pelvis given the patient's age.   Labs reviewed. No evidence of UTI but Hb on UA. CT abdomen pelvis shows large stone at the UVJ with large hydro. Spoke with Urology on call who agrees with plan for pain control, flomax, and Urology follow up early next week. DIscussed diagnosis and follow up plan with patient and caregiver at bedside. Pain is well controlled.   At this time, I do not feel there is any life-threatening condition present. I have reviewed and discussed all results (EKG, imaging, lab, urine as appropriate), exam findings with patient. I have reviewed nursing notes and appropriate previous records.  I feel the patient is safe to be discharged home without further emergent workup. Discussed usual and customary return precautions. Patient and family (if present) verbalize understanding and are comfortable with this plan.  Patient will follow-up with their primary care provider. If they do not have a primary care provider, information for follow-up has been provided to them. All questions have been answered.  ____________________________________________  FINAL CLINICAL IMPRESSION(S) / ED DIAGNOSES  Final diagnoses:  Lower abdominal pain  Kidney stone  Non-intractable vomiting with nausea, unspecified vomiting type     MEDICATIONS GIVEN DURING THIS VISIT:  Medications  sodium chloride 0.9 % bolus 500 mL (0 mLs Intravenous Stopped 01/12/18 2144)  ondansetron (ZOFRAN) injection 4 mg (4 mg Intravenous Given 01/12/18 2029)  iopamidol (ISOVUE-300) 61 % injection 50 mL (50 mLs Intravenous Contrast Given 01/12/18 2101)     NEW OUTPATIENT MEDICATIONS STARTED DURING THIS VISIT:  Discharge Medication List as of 01/12/2018 10:50 PM    START taking these medications   Details  acetaminophen-codeine (TYLENOL #3) 300-30 MG tablet Take  1 tablet by mouth every 6 (six) hours as needed for moderate pain or severe pain., Starting Fri 01/12/2018, Print    tamsulosin (FLOMAX) 0.4 MG CAPS capsule Take 1 capsule (0.4 mg total) by mouth daily for 14 days., Starting Fri 01/12/2018, Until Fri 01/26/2018, Print        Note:  This document was prepared using Dragon voice recognition software and may include unintentional dictation errors.  Alona Bene, MD Emergency Medicine    Karion Cudd, Arlyss Repress, MD 01/13/18 [phone]

## 2018-01-12 NOTE — ED Triage Notes (Signed)
EMS stated, she has been having N/V for the last 15 hours.

## 2018-01-12 NOTE — ED Triage Notes (Signed)
Pt. Stated, my last BM was 2 days ago.

## 2018-01-19 ENCOUNTER — Encounter: Payer: Self-pay | Admitting: Neurology

## 2018-04-13 ENCOUNTER — Encounter: Payer: Self-pay | Admitting: Neurology

## 2018-04-13 ENCOUNTER — Ambulatory Visit: Payer: Medicare Other | Admitting: Neurology

## 2018-04-13 VITALS — BP 140/64 | HR 68 | Ht 65.0 in | Wt 172.0 lb

## 2018-04-13 DIAGNOSIS — I10 Essential (primary) hypertension: Secondary | ICD-10-CM

## 2018-04-13 DIAGNOSIS — E119 Type 2 diabetes mellitus without complications: Secondary | ICD-10-CM | POA: Diagnosis not present

## 2018-04-13 DIAGNOSIS — Z794 Long term (current) use of insulin: Secondary | ICD-10-CM

## 2018-04-13 DIAGNOSIS — I69320 Aphasia following cerebral infarction: Secondary | ICD-10-CM

## 2018-04-13 DIAGNOSIS — E785 Hyperlipidemia, unspecified: Secondary | ICD-10-CM | POA: Diagnosis not present

## 2018-04-13 NOTE — Patient Instructions (Signed)
1. Continue aspirin 81mg  daily for secondary stroke prevention 2.  Continue pravastatin as prescribed by your PCP. 3.  Continue blood pressure medication. 4.  Keep an eye on your blood sugar 5.  Follow up as needed.

## 2018-04-13 NOTE — Progress Notes (Signed)
NEUROLOGY FOLLOW UP OFFICE NOTE  Catherine Vazquez [id number]  HISTORY OF PRESENT ILLNESS: Catherine Vazquez is a 84 year old right-handed female with hypertension, CHF and diabetes who follows up for stroke.  She is accompanied by her daughter who supplements history.   UPDATE: She is taking ASA 81mg  daily.  LDL in October was 206.  She was started on pravastatin 20mg  by her PCP.  No new events.  Every now and then she may briefly have trouble getting words out.  She checks her blood pressure in the morning.  Usually in is in the 140s or 150s systolic.  She is off of diabetic medication.  However, she says her blood sugar has been creeping up.   HISTORY: In September 2018, Ms Nugen exhibited slurred speech with some word-finding difficulty but mostly difficulty producing words.  She did not exhibit facial droop, visual disturbance, ataxia or unilateral numbness or weakness.  CT of head without contrast performed on 06/01/17 was personally reviewed and revealed atrophy and chronic small vessel ischemic changes but no acute/subacute ischemic infarct, bleed or mass lesion.  She immediately started speech therapy.  Her symptoms have improved, although she still has some word-finding difficulty but mostly difficulty producing words.  It also takes her longer to read a paragraph.  She is taking ASA 81mg  daily, which she was taking prior to this event.  She uses a cane to ambulate but has not had any falls.  She feels steady on her feet with the cane.  She also has longstanding chronic dizziness.  PAST MEDICAL HISTORY: Past Medical History:  Diagnosis Date  . Arthritis   . CHF (congestive heart failure) (HCC)   . Diabetes mellitus without complication (HCC)   . Hypertension     MEDICATIONS: Current Outpatient Medications on File Prior to Visit  Medication Sig Dispense Refill  . acetaminophen (TYLENOL) 500 MG tablet Take 500-1,000 mg by mouth every 6 (six) hours as needed (for pain).    Marland Kitchen  acetaminophen-codeine (TYLENOL #3) 300-30 MG tablet Take 1 tablet by mouth every 6 (six) hours as needed for moderate pain or severe pain. 15 tablet 0  . aspirin EC 81 MG tablet Take 81 mg by mouth daily.    . carvedilol (COREG) 6.25 MG tablet Take 6.25 mg by mouth 2 (two) times daily with a meal.    . cyanocobalamin 1000 MCG tablet Take 1,000 mcg by mouth daily.    Marland Kitchen losartan (COZAAR) 100 MG tablet Take 100 mg by mouth daily.    . Misc Natural Products (OSTEO BI-FLEX/5-LOXIN ADVANCED PO) Take 1 tablet by mouth 2 (two) times daily.     . pravastatin (PRAVACHOL) 20 MG tablet Take 20 mg by mouth at bedtime.     . pregabalin (LYRICA) 50 MG capsule Take 50 mg by mouth 2 (two) times daily.     No current facility-administered medications on file prior to visit.     ALLERGIES: No Known Allergies  FAMILY HISTORY: History reviewed. No pertinent family history.  SOCIAL HISTORY: Social History   Socioeconomic History  . Marital status: Married    Spouse name: Not on file  . Number of children: Not on file  . Years of education: Not on file  . Highest education level: Not on file  Occupational History  . Not on file  Social Needs  . Financial resource strain: Not on file  . Food insecurity:    Worry: Not on file    Inability: Not  on file  . Transportation needs:    Medical: Not on file    Non-medical: Not on file  Tobacco Use  . Smoking status: Never Smoker  . Smokeless tobacco: Never Used  Substance and Sexual Activity  . Alcohol use: No  . Drug use: No  . Sexual activity: Not on file  Lifestyle  . Physical activity:    Days per week: Not on file    Minutes per session: Not on file  . Stress: Not on file  Relationships  . Social connections:    Talks on phone: Not on file    Gets together: Not on file    Attends religious service: Not on file    Active member of club or organization: Not on file    Attends meetings of clubs or organizations: Not on file    Relationship  status: Not on file  . Intimate partner violence:    Fear of current or ex partner: Not on file    Emotionally abused: Not on file    Physically abused: Not on file    Forced sexual activity: Not on file  Other Topics Concern  . Not on file  Social History Narrative  . Not on file    REVIEW OF SYSTEMS: Constitutional: No fevers, chills, or sweats, no generalized fatigue, change in appetite Eyes: No visual changes, double vision, eye pain Ear, nose and throat: No hearing loss, ear pain, nasal congestion, sore throat Cardiovascular: No chest pain, palpitations Respiratory:  No shortness of breath at rest or with exertion, wheezes GastrointestinaI: No nausea, vomiting, diarrhea, abdominal pain, fecal incontinence Genitourinary:  No dysuria, urinary retention or frequency Musculoskeletal:  No neck pain, back pain Integumentary: No rash, pruritus, skin lesions Neurological: as above Psychiatric: No depression, insomnia, anxiety Endocrine: No palpitations, fatigue, diaphoresis, mood swings, change in appetite, change in weight, increased thirst Hematologic/Lymphatic:  No purpura, petechiae. Allergic/Immunologic: no itchy/runny eyes, nasal congestion, recent allergic reactions, rashes  PHYSICAL EXAM: Vitals:   04/13/18 1006  BP: 140/64  Pulse: 68  SpO2: 97%   General: No acute distress.  Patient appears well-groomed.   Head:  Normocephalic/atraumatic Eyes:  Fundi examined but not visualized Neck: supple, no paraspinal tenderness, full range of motion Heart:  Regular rate and rhythm Lungs:  Clear to auscultation bilaterally Back: No paraspinal tenderness Neurological Exam: alert and oriented to person, place, and time. Attention span and concentration intact, recent and remote memory intact, fund of knowledge intact.  Speech fluent and not dysarthric, language intact.  CN II-XII intact. Bulk and tone normal, muscle strength 5/5 throughout.  Sensation to light touch  intact.  Deep  tendon reflexes 1+ upper extremities, absent lower extremities.  Finger to nose testing intact.  Cautious wide-based gait. Romberg negative.  IMPRESSION: Probable aphasia secondary to ischemic stroke.   Hypertension Type 2 diabetes mellitus, currently diet controlled.  PLAN: 1.  Continue ASA 81mg  daily for secondary stroke prevention 2.  Continue pravastatin 20mg  daily.  LDL goal should be around 70 or less. 3.  Optimize blood pressure control. 4.  Monitor blood glucose level. Secondary stroke prevention may be managed by PCP.  She may follow up as needed.  15 minutes spent face to face with patient, over 50% spent discussing management.     Shon Millet, DO  CC:  Leilani Able, MD

## 2018-04-16 IMAGING — CR DG KNEE COMPLETE 4+V*R*
4 series · 4 of 4 positions shown · non-contrast
Comparison: None.

CLINICAL DATA: Right knee pain and swelling for 2 days without
known injury

EXAM:
RIGHT KNEE - COMPLETE 4+ VIEW

[knee ap]
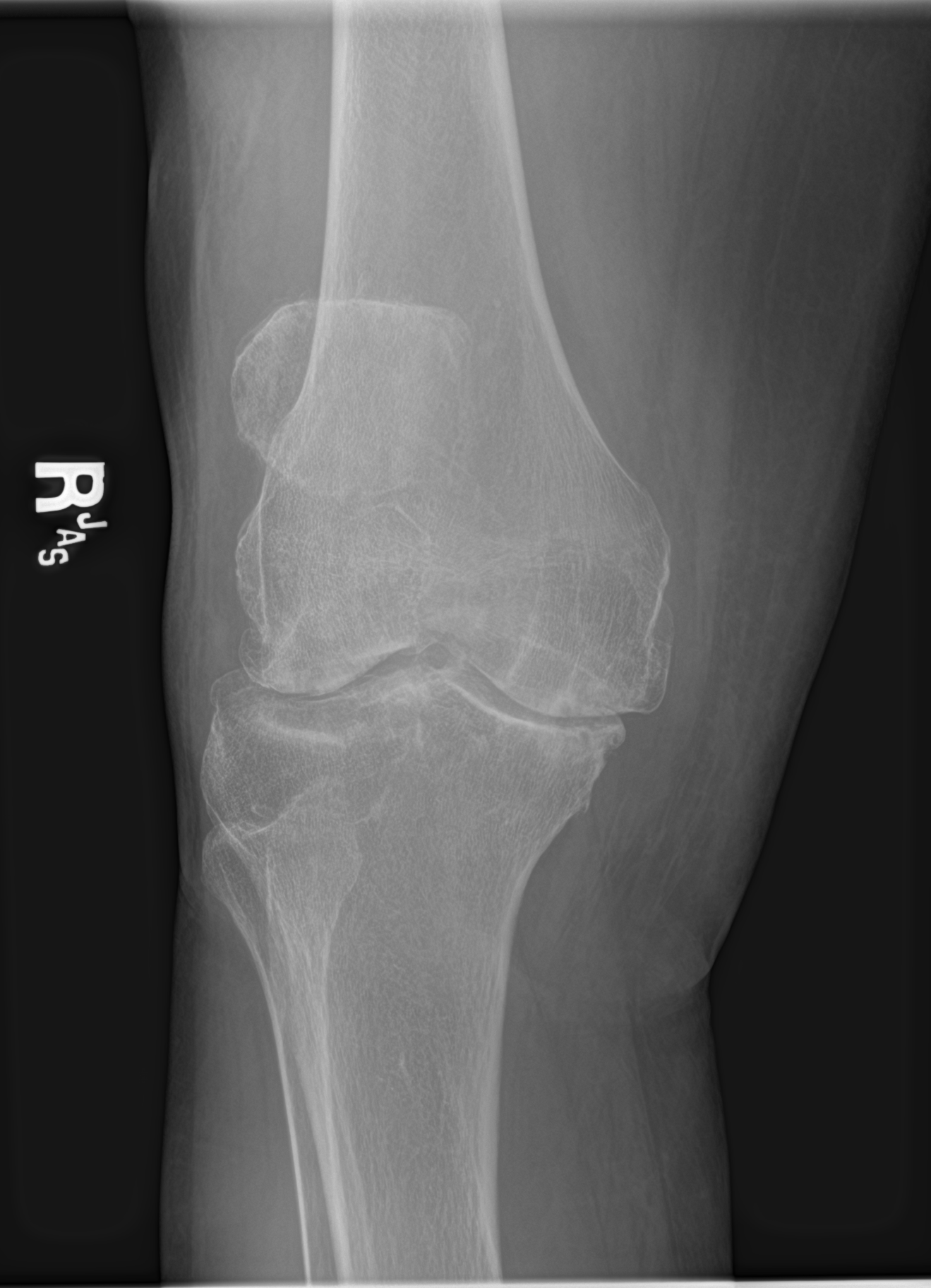

[knee lat]
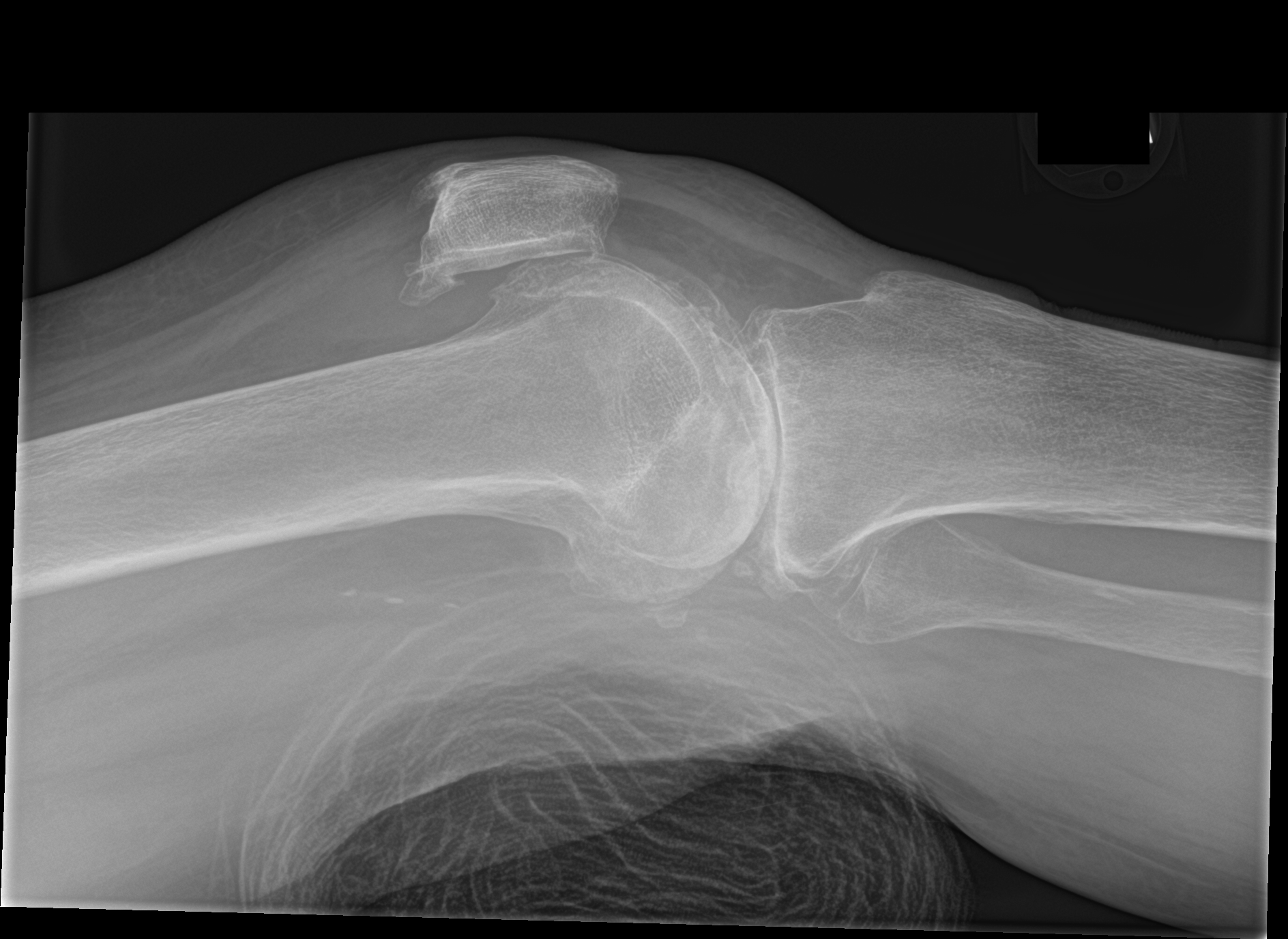

[knee obl (1 of 2)]
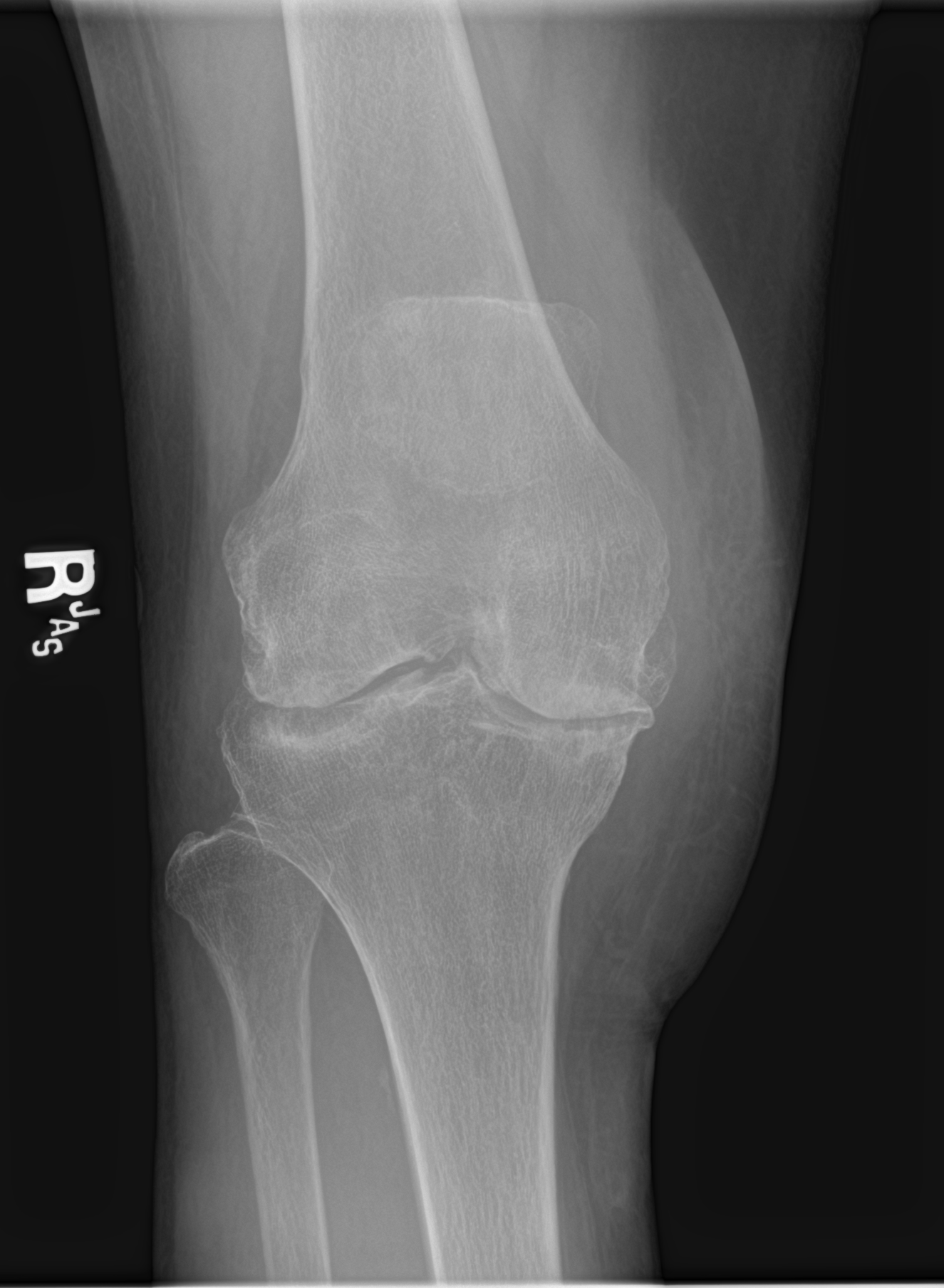

[knee obl (2 of 2)]
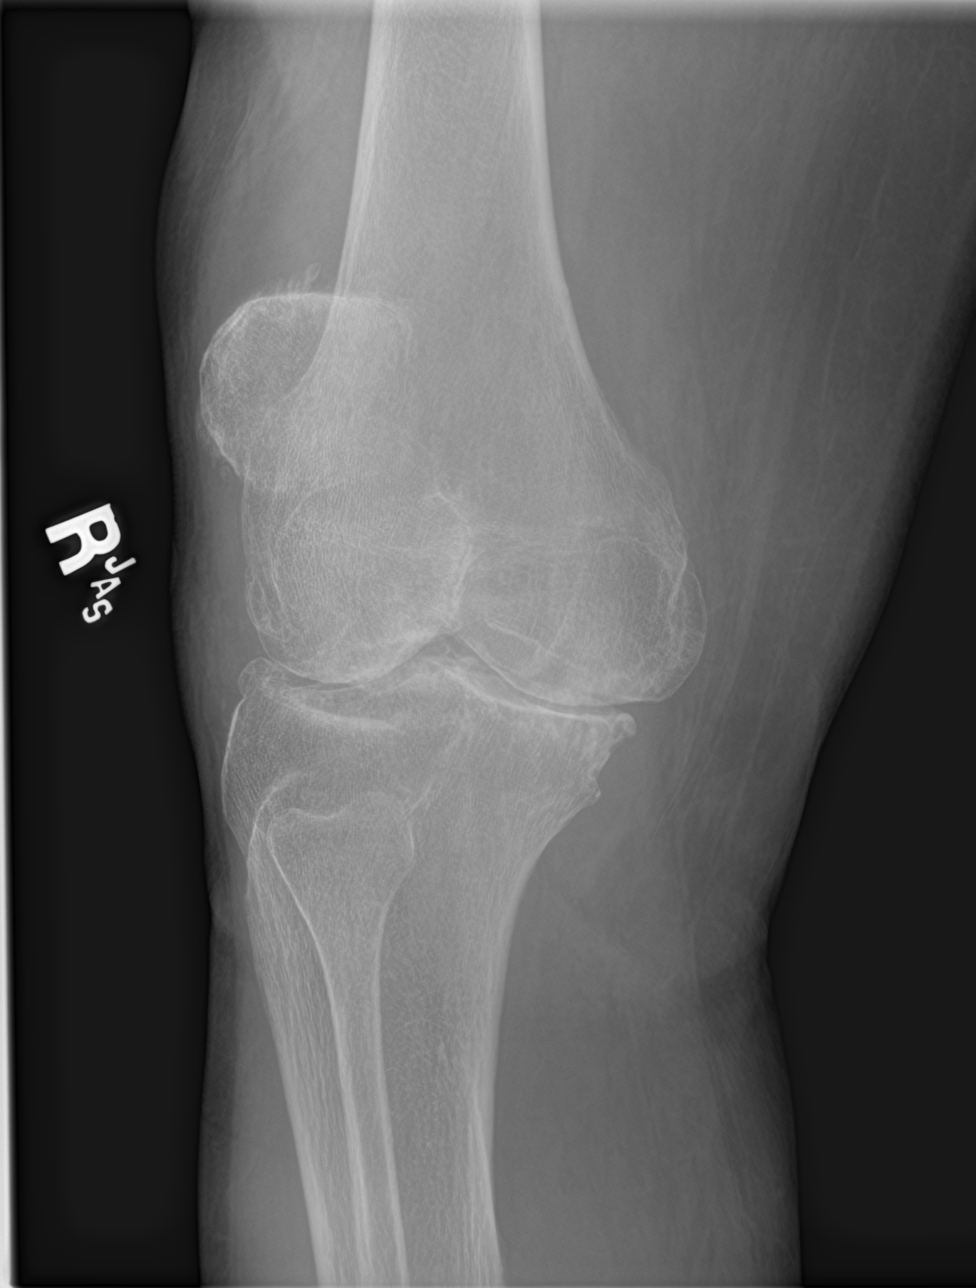

[4 of 4 positions shown; findings below may reference images not displayed]

FINDINGS: There is femorotibial and patellofemoral joint space narrowing with
spurring and subchondral degenerative cystic lucencies involving the
femoral condyles and tibial plateau consistent with osteoarthritis.
There is a small suprapatellar joint effusion. No acute fractures
noted. Tiny ossification noted posterior to the knee joint may
represent a small loose body. Soft tissues unremarkable.
IMPRESSION: Tricompartmental osteoarthritis with small joint effusion and
probable posterior intra-articular ossific loose body. No acute
osseous abnormality.

## 2019-12-27 ENCOUNTER — Ambulatory Visit: Payer: Medicare Other | Admitting: Neurology

## 2019-12-31 ENCOUNTER — Encounter: Payer: Self-pay | Admitting: Neurology

## 2020-01-01 ENCOUNTER — Other Ambulatory Visit (HOSPITAL_COMMUNITY): Payer: Self-pay

## 2020-01-01 ENCOUNTER — Other Ambulatory Visit: Payer: Self-pay

## 2020-01-01 ENCOUNTER — Ambulatory Visit (HOSPITAL_COMMUNITY): Admission: RE | Admit: 2020-01-01 | Payer: Medicare PPO | Source: Ambulatory Visit

## 2020-01-01 ENCOUNTER — Encounter: Payer: Self-pay | Admitting: Neurology

## 2020-01-01 ENCOUNTER — Telehealth (INDEPENDENT_AMBULATORY_CARE_PROVIDER_SITE_OTHER): Payer: Medicare PPO | Admitting: Neurology

## 2020-01-01 ENCOUNTER — Ambulatory Visit (HOSPITAL_COMMUNITY)
Admission: RE | Admit: 2020-01-01 | Discharge: 2020-01-01 | Disposition: A | Discharge status: Home / Self Care | Payer: Medicare PPO | Source: Ambulatory Visit | Attending: Neurology | Admitting: Neurology

## 2020-01-01 ENCOUNTER — Telehealth: Payer: Self-pay

## 2020-01-01 ENCOUNTER — Telehealth: Payer: Self-pay | Admitting: Neurology

## 2020-01-01 VITALS — Ht 65.0 in | Wt 168.0 lb

## 2020-01-01 DIAGNOSIS — I639 Cerebral infarction, unspecified: Secondary | ICD-10-CM

## 2020-01-01 DIAGNOSIS — E119 Type 2 diabetes mellitus without complications: Secondary | ICD-10-CM

## 2020-01-01 DIAGNOSIS — Z794 Long term (current) use of insulin: Secondary | ICD-10-CM | POA: Diagnosis not present

## 2020-01-01 DIAGNOSIS — I10 Essential (primary) hypertension: Secondary | ICD-10-CM | POA: Diagnosis not present

## 2020-01-01 MED ORDER — GADOBUTROL 1 MMOL/ML IV SOLN
7.5000 mL | Freq: Once | INTRAVENOUS | Status: AC | PRN
  Administered 2020-01-01: 7.5 mL via INTRAVENOUS

## 2020-01-01 NOTE — Telephone Encounter (Signed)
[employer] authorization number is [id number].  Information given.

## 2020-01-01 NOTE — Progress Notes (Signed)
Virtual Visit via Video Note The purpose of this virtual visit is to provide medical care while limiting exposure to the novel coronavirus.    Consent was obtained for video visit:  Yes.   Answered questions that patient had about telehealth interaction:  Yes.   I discussed the limitations, risks, security and privacy concerns of performing an evaluation and management service by telemedicine. I also discussed with the patient that there may be a patient responsible charge related to this service. The patient expressed understanding and agreed to proceed.  Pt location: Home Physician Location: office Name of referring provider:  Leilani Able, MD I connected with Catherine Vazquez at patients initiation/request on 01/01/2020 at  7:50 AM EDT by video enabled telemedicine application and verified that I am speaking with the correct person using two identifiers. Pt MRN:  [id number] Pt DOB:  1925-01-18 Video Participants:  Catherine Vazquez   History of Present Illness:  Catherine Vazquez is a 86 year old right-handed female with hypertension, CHF and diabetes who follows up for stroke.  She is accompanied by her daughter-in-law who supplements history.  UPDATE: Last seen in July 2019. Current medications:  ASA 81mg ; pravastatin 20mg ; losartan 100mg ; carvedilol 6.25mg  twice daily.  Her daughter reports recurrence of slurred speech in October or November. She reports increased trouble getting words out.  She reports bilateral arm and leg weakness but appears to be worse on the right side. No progression since then.  Yesterday, she had a vivid dream and fell out of bed.  She was unable to stand up and reportedly was on the floor for 5 hours as she couldn't reach the phone.  EMS arrived and she declined ED visit.  She reports that she has been feeling fine.    HISTORY: In September 2018, Catherine Vazquez exhibited slurred speech with some word-finding difficulty but mostly difficulty producing words.   She did not exhibit facial droop, visual disturbance, ataxia or unilateral numbness or weakness.  CT of head without contrast performed on 06/01/17 was personally reviewed and revealed atrophy and chronic small vessel ischemic changes but no acute/subacute ischemic infarct, bleed or mass lesion.  She immediately started speech therapy.  Her symptoms have improved, although she still has some word-finding difficulty but mostly difficulty producing words.  It also takes her longer to read a paragraph.  She is taking ASA 81mg  daily, which she was taking prior to this event.  She uses a cane to ambulate but has not had any falls.  She feels steady on her feet with the cane.  She also has longstanding chronic dizziness.  Past Medical History: Past Medical History:  Diagnosis Date  . Arthritis   . CHF (congestive heart failure) (HCC)   . Diabetes mellitus without complication (HCC)   . Hypertension     Medications: Outpatient Encounter Medications as of 01/01/2020  Medication Sig Note  . acetaminophen (TYLENOL) 500 MG tablet Take 500-1,000 mg by mouth every 6 (six) hours as needed (for pain).   Burgess Estelle acetaminophen-codeine (TYLENOL #3) 300-30 MG tablet Take 1 tablet by mouth every 6 (six) hours as needed for moderate pain or severe pain.   08/01/17 aspirin EC 81 MG tablet Take 81 mg by mouth daily.   . carvedilol (COREG) 6.25 MG tablet Take 6.25 mg by mouth 2 (two) times daily with a meal.   . cyanocobalamin 1000 MCG tablet Take 1,000 mcg by mouth daily.   losartan (COZAAR) 100 MG tablet Take 100  mg by mouth daily.   . Misc Natural Products (OSTEO BI-FLEX/5-LOXIN ADVANCED PO) Take 1 tablet by mouth 2 (two) times daily.    . pravastatin (PRAVACHOL) 20 MG tablet Take 20 mg by mouth at bedtime.    . [DISCONTINUED] pregabalin (LYRICA) 50 MG capsule Take 50 mg by mouth 2 (two) times daily. 01/12/2018: Patient is currently receiving samples of this    No facility-administered encounter medications on file as of  01/01/2020.    Allergies: No Known Allergies  Family History: Family History  Family history unknown: Yes    Social History: Social History   Socioeconomic History  . Marital status: Married    Spouse name: Not on file  . Number of children: Not on file  . Years of education: Not on file  . Highest education level: Not on file  Occupational History  . Not on file  Tobacco Use  . Smoking status: Never Smoker  . Smokeless tobacco: Never Used  Substance and Sexual Activity  . Alcohol use: No  . Drug use: No  . Sexual activity: Not on file  Other Topics Concern  . Not on file  Social History Narrative  . Not on file   Social Determinants of Health   Financial Resource Strain:   . Difficulty of Paying Living Expenses:   Food Insecurity:   . Worried About Charity fundraiser in the Last Year:   . Arboriculturist in the Last Year:   Transportation Needs:   . Film/video editor (Medical):   Marland Kitchen Lack of Transportation (Non-Medical):   Physical Activity:   . Days of Exercise per Week:   . Minutes of Exercise per Session:   Stress:   . Feeling of Stress :   Social Connections:   . Frequency of Communication with Friends and Family:   . Frequency of Social Gatherings with Friends and Family:   . Attends Religious Services:   . Active Member of Clubs or Organizations:   . Attends Archivist Meetings:   Marland Kitchen Marital Status:   Intimate Partner Violence:   . Fear of Current or Ex-Partner:   . Emotionally Abused:   Marland Kitchen Physically Abused:   . Sexually Abused:     Observations/Objective:   Height 5\' 5"  (1.651 m), weight 168 lb (76.2 kg). No acute distress.  Alert and oriented.  Speech fluent and not dysarthric.  Language intact.  Eyes orthophoric on primary gaze.  Face symmetric.  Assessment and Plan:   1.  Recurrence of expressive aphasia with reported right sided weakness, concerning for left MCA territory infarct. 2.  Hypertension 3. Type 2 diabetes  mellitus 4.  Fall  Concern that she had another stroke.  She was also on the ground for 5 hours, therefore would be concerned for rhabdomyolysis.  She appears okay, however.  1.  Will get MRI of brain and MRA of head and neck urgently. 2.  Will check echocardiogram 3.  Will check lipid panel, Hgb A1c and CK 4.  She is planning to see her PCP as well 5.  Advised to wear a life alert on self at all times. 6.  Further recommendations pending results.  Follow Up Instructions:    -I discussed the assessment and treatment plan with the patient. The patient was provided an opportunity to ask questions and all were answered. The patient agreed with the plan and demonstrated an understanding of the instructions.   The patient was advised to call back  or seek an in-person evaluation if the symptoms worsen or if the condition fails to improve as anticipated.    Cira Servant, DO

## 2020-01-01 NOTE — Telephone Encounter (Signed)
Catherine Vazquez called states that the [facility] needs a prior auth before they can do the test

## 2020-01-01 NOTE — Telephone Encounter (Signed)
Lupita Leash called to check on this. The test is at 1:00 PM today.

## 2020-01-01 NOTE — Telephone Encounter (Signed)
Telephone call to pt to inform pt of orders and labs.

## 2020-01-01 NOTE — Telephone Encounter (Signed)
-----   Message from Drema Dallas, DO sent at 01/01/2020  8:05 AM EDT ----- Hello,  For Catherine Vazquez, we will be performing a stroke work-up: 1.  MRI of brain without contrast, MRA of head without contrast, MRA of neck with and without contrast URGENT 2.  Complete 2D echocardiogram 3.  Lipid panel, Hgb A1c, CK  Dx: CVA diabetes

## 2020-01-02 ENCOUNTER — Other Ambulatory Visit (INDEPENDENT_AMBULATORY_CARE_PROVIDER_SITE_OTHER): Payer: Medicare PPO

## 2020-01-02 ENCOUNTER — Other Ambulatory Visit: Payer: Self-pay

## 2020-01-02 ENCOUNTER — Telehealth: Payer: Self-pay | Admitting: Neurology

## 2020-01-02 ENCOUNTER — Ambulatory Visit (HOSPITAL_COMMUNITY)
Admission: RE | Admit: 2020-01-02 | Discharge: 2020-01-02 | Disposition: A | Discharge status: Home / Self Care | Payer: Medicare PPO | Source: Ambulatory Visit | Attending: Neurology | Admitting: Neurology

## 2020-01-02 DIAGNOSIS — E119 Type 2 diabetes mellitus without complications: Secondary | ICD-10-CM | POA: Diagnosis not present

## 2020-01-02 DIAGNOSIS — I639 Cerebral infarction, unspecified: Secondary | ICD-10-CM | POA: Insufficient documentation

## 2020-01-02 DIAGNOSIS — I110 Hypertensive heart disease with heart failure: Secondary | ICD-10-CM | POA: Insufficient documentation

## 2020-01-02 DIAGNOSIS — E785 Hyperlipidemia, unspecified: Secondary | ICD-10-CM

## 2020-01-02 LAB — LIPID PANEL
Cholesterol: 229 mg/dL — ABNORMAL HIGH (ref 0–200)
HDL: 70.1 mg/dL (ref >39.00)
LDL Cholesterol: 139 mg/dL — ABNORMAL HIGH (ref 0–99)
NonHDL: 158.75
Total CHOL/HDL Ratio: 3
Triglycerides: 101 mg/dL (ref 0.0–149.0)
VLDL: 20.2 mg/dL (ref 0.0–40.0)

## 2020-01-02 LAB — HEMOGLOBIN A1C: Hgb A1c MFr Bld: 7.2 % — ABNORMAL HIGH (ref 4.6–6.5)

## 2020-01-02 LAB — CK: Total CK: 294 U/L — ABNORMAL HIGH (ref 7–177)

## 2020-01-02 MED ORDER — PRAVASTATIN SODIUM 40 MG PO TABS
40.0000 mg | ORAL_TABLET | Freq: Every day | ORAL | 0 refills | Status: DC
Start: 2020-01-02 — End: 2021-11-17

## 2020-01-02 MED ORDER — CLOPIDOGREL BISULFATE 75 MG PO TABS: 75.0000 mg | ORAL_TABLET | Freq: Every day | ORAL | 5 refills | Status: DC

## 2020-01-02 NOTE — Telephone Encounter (Signed)
Per Dr. Everlena Cooper Pravastatin 20mg  increased to 40mg . Pt aware of change in dosing.

## 2020-01-02 NOTE — Progress Notes (Signed)
  Echocardiogram 2D Echocardiogram has been performed.  Celene Skeen 01/02/2020, 9:11 AM

## 2020-01-02 NOTE — Progress Notes (Signed)
Order added for lipid panel x 3 mos out. Pt aware of labs needed and will call to schedule a visit.

## 2020-01-02 NOTE — Telephone Encounter (Signed)
I called and spoke with patient's daughter-in-law, Maxine Fredman regarding MRI results.  No acute or subacute stroke.  MRA does show evidence of basilar stenosis.  No left MCA or left ICA stenosis, which would correlate with her symptoms.  Given that she had another clinical event, I would like to change aspirin to Plavix 75mg  daily.  I explained that there is increased risk of bleeding.  She is agreeable with this plan.  Echocardiogram looked okay.

## 2020-04-07 ENCOUNTER — Emergency Department (HOSPITAL_COMMUNITY): Payer: Medicare PPO

## 2020-04-07 ENCOUNTER — Encounter (HOSPITAL_COMMUNITY): Payer: Self-pay | Admitting: Emergency Medicine

## 2020-04-07 ENCOUNTER — Other Ambulatory Visit: Payer: Self-pay

## 2020-04-07 ENCOUNTER — Emergency Department (HOSPITAL_COMMUNITY)
Admission: EM | Admit: 2020-04-07 | Discharge: 2020-04-07 | Disposition: A | Discharge status: Home / Self Care | Payer: Medicare PPO | Attending: Emergency Medicine | Admitting: Emergency Medicine

## 2020-04-07 DIAGNOSIS — E119 Type 2 diabetes mellitus without complications: Secondary | ICD-10-CM | POA: Diagnosis not present

## 2020-04-07 DIAGNOSIS — Z79899 Other long term (current) drug therapy: Secondary | ICD-10-CM | POA: Diagnosis not present

## 2020-04-07 DIAGNOSIS — R0789 Other chest pain: Secondary | ICD-10-CM | POA: Diagnosis present

## 2020-04-07 DIAGNOSIS — Z7984 Long term (current) use of oral hypoglycemic drugs: Secondary | ICD-10-CM | POA: Diagnosis not present

## 2020-04-07 DIAGNOSIS — I110 Hypertensive heart disease with heart failure: Secondary | ICD-10-CM | POA: Diagnosis not present

## 2020-04-07 DIAGNOSIS — I5032 Chronic diastolic (congestive) heart failure: Secondary | ICD-10-CM | POA: Diagnosis not present

## 2020-04-07 DIAGNOSIS — Z7901 Long term (current) use of anticoagulants: Secondary | ICD-10-CM | POA: Diagnosis not present

## 2020-04-07 LAB — BASIC METABOLIC PANEL WITH GFR
Anion gap: 8 (ref 5–15)
BUN: 14 mg/dL (ref 8–23)
CO2: 28 mmol/L (ref 22–32)
Calcium: 9.5 mg/dL (ref 8.9–10.3)
Chloride: 105 mmol/L (ref 98–111)
Creatinine, Ser: 0.84 mg/dL (ref 0.44–1.00)
GFR calc Af Amer: >60 mL/min
GFR calc non Af Amer: >60 mL/min
Glucose, Bld: 230 mg/dL — ABNORMAL HIGH (ref 70–99)
Potassium: 4.2 mmol/L (ref 3.5–5.1)
Sodium: 141 mmol/L (ref 135–145)

## 2020-04-07 LAB — CBC
HCT: 41.2 % (ref 36.0–46.0)
Hemoglobin: 13.4 g/dL (ref 12.0–15.0)
MCH: 32.4 pg (ref 26.0–34.0)
MCHC: 32.5 g/dL (ref 30.0–36.0)
MCV: 99.8 fL (ref 80.0–100.0)
Platelets: 254 K/uL (ref 150–400)
RBC: 4.13 MIL/uL (ref 3.87–5.11)
RDW: 13.2 % (ref 11.5–15.5)
WBC: 4.6 K/uL (ref 4.0–10.5)
nRBC: 0 % (ref 0.0–0.2)

## 2020-04-07 LAB — TROPONIN I (HIGH SENSITIVITY)
Troponin I (High Sensitivity): 11 ng/L (ref <18)
Troponin I (High Sensitivity): 11 ng/L (ref <18)

## 2020-04-07 MED ORDER — FAMOTIDINE 20 MG PO TABS
20.0000 mg | ORAL_TABLET | Freq: Every day | ORAL | 0 refills | Status: DC
Start: 2020-04-07 — End: 2021-11-17

## 2020-04-07 NOTE — ED Provider Notes (Signed)
[facility] EMERGENCY DEPARTMENT Provider Note   CSN: [id number] Arrival date & time: 04/07/20  1019     History Chief Complaint  Patient presents with  . Chest Pain    Catherine Vazquez is a 86 y.o. female.  Pt presents to the ED today with cp.  Pt said she's had intermittent cp for the past 2 months.  Pt had a sharp pain today and another one that radiated to her right arm.  Pt denies any current cp.  No sob.  No n/v.  Pain feels like indigestion.        Past Medical History:  Diagnosis Date  . Arthritis   . CHF (congestive heart failure) (HCC)   . Diabetes mellitus without complication (HCC)   . Hypertension     Patient Active Problem List   Diagnosis Date Noted  . CHF with left ventricular diastolic dysfunction, NYHA class 2 (HCC) 09/13/2013  . DOE (dyspnea on exertion) 09/12/2013  . Fatigue 09/12/2013  . HBP (high blood pressure) 09/12/2013  . Arthritis     Past Surgical History:  Procedure Laterality Date  . ABDOMINAL HYSTERECTOMY    . BUNIONECTOMY    . CARPAL TUNNEL RELEASE    . EYE SURGERY    . KNEE ARTHROSCOPY    . ROTATOR CUFF REPAIR    . TONSILLECTOMY       OB History   No obstetric history on file.     Family History  Family history unknown: Yes    Social History   Tobacco Use  . Smoking status: Never Smoker  . Smokeless tobacco: Never Used  Substance Use Topics  . Alcohol use: No  . Drug use: No    Home Medications Prior to Admission medications   Medication Sig Start Date End Date Taking? Authorizing Provider  clopidogrel (PLAVIX) 75 MG tablet Take 1 tablet (75 mg total) by mouth daily. 01/02/20  Yes Jaffe, Adam R, DO  lidocaine (XYLOCAINE) 5 % ointment Apply 1 application topically as needed for mild pain or moderate pain.   Yes [provider]  Misc Natural Products (OSTEO BI-FLEX/5-LOXIN ADVANCED PO) Take 1 tablet by mouth 2 (two) times daily.    Yes [provider]  OVER THE COUNTER  MEDICATION Take 1 capsule by mouth daily. The ultimate - Healthy longevity supplement.   Yes [provider]  pravastatin (PRAVACHOL) 40 MG tablet Take 1 tablet (40 mg total) by mouth daily. 01/02/20  Yes Jaffe, Adam R, DO  pyridOXINE (VITAMIN B-6) 100 MG tablet Take 100 mg by mouth daily.   Yes [provider]  acetaminophen (TYLENOL) 500 MG tablet Take 500-1,000 mg by mouth every 6 (six) hours as needed (for pain). Patient not taking: Reported on 04/07/2020    [provider]  acetaminophen-codeine (TYLENOL #3) 300-30 MG tablet Take 1 tablet by mouth every 6 (six) hours as needed for moderate pain or severe pain. Patient not taking: Reported on 04/07/2020 01/12/18   Maia Plan, MD  aspirin EC 81 MG tablet Take 81 mg by mouth daily. Patient not taking: Reported on 04/07/2020    [provider]  carvedilol (COREG) 6.25 MG tablet Take 6.25 mg by mouth 2 (two) times daily with a meal. Patient not taking: Reported on 04/07/2020    [provider]  cyanocobalamin 1000 MCG tablet Take 1,000 mcg by mouth daily. Patient not taking: Reported on 04/07/2020    [provider]  famotidine (PEPCID) 20 MG tablet  Take 1 tablet (20 mg total) by mouth daily. 04/07/20   Jacalyn Lefevre, MD  losartan (COZAAR) 100 MG tablet Take 100 mg by mouth daily. Patient not taking: Reported on 04/07/2020    [provider]    Allergies    Patient has no known allergies.  Review of Systems   Review of Systems  Cardiovascular: Positive for chest pain.  All other systems reviewed and are negative.   Physical Exam Updated Vital Signs BP (!) 154/70   Pulse 63   Resp 14   Wt 76.2 kg   SpO2 100%   BMI 27.96 kg/m   Physical Exam Vitals and nursing note reviewed.  Constitutional:      Appearance: She is well-developed.  HENT:     Head: Normocephalic and atraumatic.  Eyes:     Extraocular Movements: Extraocular movements intact.     Pupils: Pupils are  equal, round, and reactive to light.  Cardiovascular:     Rate and Rhythm: Normal rate and regular rhythm.     Heart sounds: Normal heart sounds.  Pulmonary:     Effort: Pulmonary effort is normal.     Breath sounds: Normal breath sounds.  Abdominal:     General: Bowel sounds are normal.     Palpations: Abdomen is soft.  Musculoskeletal:        General: Normal range of motion.     Cervical back: Normal range of motion and neck supple.  Skin:    General: Skin is warm.     Capillary Refill: Capillary refill takes less than 2 seconds.  Neurological:     General: No focal deficit present.     Mental Status: She is alert and oriented to person, place, and time.  Psychiatric:        Mood and Affect: Mood normal.        Behavior: Behavior normal.     ED Results / Procedures / Treatments   Labs (all labs ordered are listed, but only abnormal results are displayed) Labs Reviewed  BASIC METABOLIC PANEL - Abnormal; Notable for the following components:      Result Value   Glucose, Bld 230 (*)    All other components within normal limits  CBC  TROPONIN I (HIGH SENSITIVITY)  TROPONIN I (HIGH SENSITIVITY)    EKG EKG Interpretation  Date/Time:  Tuesday April 07 2020 10:25:38 EDT Ventricular Rate:  70 PR Interval:    QRS Duration: 132 QT Interval:  415 QTC Calculation: 448 R Axis:   63 Text Interpretation: Sinus rhythm Prolonged PR interval Right bundle branch block No significant change since last tracing Confirmed by Jacalyn Lefevre [id number]) on 04/07/2020 11:34:33 AM   Radiology DG Chest 2 View  Result Date: 04/07/2020 CLINICAL DATA:  Chest pain EXAM: CHEST - 2 VIEW COMPARISON:  11/13/2015 chest radiograph. FINDINGS: Stable cardiomediastinal silhouette with normal heart size. No pneumothorax. No pleural effusion. Lungs appear clear, with no acute consolidative airspace disease and no pulmonary edema. IMPRESSION: No active cardiopulmonary disease. Electronically Signed   By: Delbert Phenix M.D.   On: 04/07/2020 11:06    Procedures Procedures (including critical care time)  Medications Ordered in ED Medications - No data to display  ED Course  I have reviewed the triage vital signs and the nursing notes.  Pertinent labs & imaging results that were available during my care of the patient were reviewed by me and considered in my medical decision making (see chart for details).    MDM  Rules/Calculators/A&P                          Pt has had 2 negative troponins.  EKG shows nothing acute.  Pain very atypical.  Pt is stable for d/c.  Return if worse.   Final Clinical Impression(s) / ED Diagnoses Final diagnoses:  Atypical chest pain    Rx / DC Orders ED Discharge Orders         Ordered    famotidine (PEPCID) 20 MG tablet  Daily     Discontinue     04/07/20 1525           Jacalyn Lefevre, MD 04/07/20 1526

## 2020-04-07 NOTE — ED Triage Notes (Signed)
Pt in w/intermittent cp x 2 mo's. States she had a very sharp central pain that radiated to R arm this am. Pain is now resolved. Denies any n/v or sob. No NTG or ASA given PTA. Hx of HTN and MI. VSS

## 2020-04-07 NOTE — ED Notes (Signed)
Patient verbalizes understanding of discharge instructions. Opportunity for questioning and answers were provided. Armband removed by staff, pt discharged from ED in wheelchair to home.   

## 2020-05-13 NOTE — Progress Notes (Signed)
NEUROLOGY FOLLOW UP OFFICE NOTE  Catherine Vazquez [id number]  HISTORY OF PRESENT ILLNESS: Catherine Vazquez is a 86 year old right-handed female with hypertension, CHF and diabetes who follows up for stroke. She is accompanied by her grandson (and daughter via phone) who supplements history.  UPDATE: Current medications:  ASA 81mg ; pravastatin 20mg ; losartan 100mg ; carvedilol 6.25mg  twice daily.  Due to another possible stroke, she underwent stroke workup: 01/01/2020 MRI BRAIN WO:  Brain imaging does not show any acute or subacute infarction. Chronic small-vessel ischemic changes of the hemispheric white matter. Old right frontal cortical and subcortical infarctions. 01/01/2020 MRA HEAD & NECK:  Neck MR angiography does not show any carotid bifurcation stenosis or irregularity. No vertebral disease suspected in the neck.  Intracranial MR angiography does not show any anterior circulation large or medium vessel occlusion or correctable proximal stenosis. There is some distal vessel atherosclerotic irregularity. 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 Intracranial posterior circulation shows a severely diseased distal left vertebral artery which is either occluded or gives minimal supply to the basilar. The basilar artery shows serial stenoses in the proximal to midportion.  Pronounced irregularity is noted within the right PCA branch vessels. 01/02/2020 ECHOCARDIOGRAM:  EF 60-65%.  No thrombus or atrial level shunt. 01/01/2020 LABS:  Hgb A1c 7.2, CK 294 01/02/2020 LABS:  LDL 139  ASA was changed to Plavix.  Pravastatin was increased to 40mg  daily.  Advised to follow up with PCP to optimize glycemic control.    HISTORY: In September 2018, Ms Barua exhibited slurred speech with some word-finding difficulty but mostly difficulty producing  words. She did not exhibit facial droop, visual disturbance, ataxia or unilateral numbness or weakness. CT of head without contrast performed on 06/01/17 was personally reviewed and revealed atrophy and chronic small vessel ischemic changes but no acute/subacute ischemic infarct, bleed or mass lesion. She immediately started speech therapy. Her symptoms have improved, although she still has some word-finding difficulty but mostly difficulty producing words. It also takes her longer to read a paragraph. She was taking ASA 81mg  daily, which she was taking prior to this event.   Her daughter reported recurrence of slurred speech in October or November 2020. She reports increased trouble getting words out.  She reports bilateral arm and leg weakness but appears to be worse on the right side. No progression since then.  In April 2021, she had a vivid dream and fell out of bed.  She was unable to stand up and reportedly was on the floor for 5 hours as she couldn't reach the phone.  EMS arrived and she declined ED visit.  She reports that she has been feeling fine.     She uses a cane to ambulate but has not had any falls. She feels steady on her feet with the cane. She also has longstanding chronic dizziness.  PAST MEDICAL HISTORY: Past Medical History:  Diagnosis Date  . Arthritis   . CHF (congestive heart failure) (HCC)   . Diabetes mellitus without complication (HCC)   . Hypertension     MEDICATIONS: Current Outpatient Medications on File Prior to Visit  Medication Sig Dispense Refill  . acetaminophen (TYLENOL) 500 MG tablet Take 500-1,000 mg by mouth every 6 (six) hours as needed (for pain). (Patient not taking: Reported on 04/07/2020)    . acetaminophen-codeine (TYLENOL #3) 300-30 MG tablet  Take 1 tablet by mouth every 6 (six) hours as needed for moderate pain or severe pain. (Patient not taking: Reported on 04/07/2020) 15 tablet 0  . aspirin EC 81 MG tablet Take 81 mg by mouth daily.  (Patient not taking: Reported on 04/07/2020)    . carvedilol (COREG) 6.25 MG tablet Take 6.25 mg by mouth 2 (two) times daily with a meal. (Patient not taking: Reported on 04/07/2020)    . clopidogrel (PLAVIX) 75 MG tablet Take 1 tablet (75 mg total) by mouth daily. 30 tablet 5  . cyanocobalamin 1000 MCG tablet Take 1,000 mcg by mouth daily. (Patient not taking: Reported on 04/07/2020)    . famotidine (PEPCID) 20 MG tablet Take 1 tablet (20 mg total) by mouth daily. 30 tablet 0  . lidocaine (XYLOCAINE) 5 % ointment Apply 1 application topically as needed for mild pain or moderate pain.    Marland Kitchen losartan (COZAAR) 100 MG tablet Take 100 mg by mouth daily. (Patient not taking: Reported on 04/07/2020)    . Misc Natural Products (OSTEO BI-FLEX/5-LOXIN ADVANCED PO) Take 1 tablet by mouth 2 (two) times daily.     Marland Kitchen OVER THE COUNTER MEDICATION Take 1 capsule by mouth daily. The ultimate - Healthy longevity supplement.    . pravastatin (PRAVACHOL) 40 MG tablet Take 1 tablet (40 mg total) by mouth daily. 90 tablet 0  . pyridOXINE (VITAMIN B-6) 100 MG tablet Take 100 mg by mouth daily.     No current facility-administered medications on file prior to visit.    ALLERGIES: No Known Allergies  FAMILY HISTORY: Family History  Family history unknown: Yes    SOCIAL HISTORY: Social History   Socioeconomic History  . Marital status: Married    Spouse name: Not on file  . Number of children: Not on file  . Years of education: Not on file  . Highest education level: Not on file  Occupational History  . Not on file  Tobacco Use  . Smoking status: Never Smoker  . Smokeless tobacco: Never Used  Substance and Sexual Activity  . Alcohol use: No  . Drug use: No  . Sexual activity: Not on file  Other Topics Concern  . Not on file  Social History Narrative  . Not on file   Social Determinants of Health   Financial Resource Strain:   . Difficulty of Paying Living Expenses:   Food Insecurity:   . Worried  About Programme researcher, broadcasting/film/video in the Last Year:   . Barista in the Last Year:   Transportation Needs:   . Freight forwarder (Medical):   Marland Kitchen Lack of Transportation (Non-Medical):   Physical Activity:   . Days of Exercise per Week:   . Minutes of Exercise per Session:   Stress:   . Feeling of Stress :   Social Connections:   . Frequency of Communication with Friends and Family:   . Frequency of Social Gatherings with Friends and Family:   . Attends Religious Services:   . Active Member of Clubs or Organizations:   . Attends Banker Meetings:   Marland Kitchen Marital Status:   Intimate Partner Violence:   . Fear of Current or Ex-Partner:   . Emotionally Abused:   Marland Kitchen Physically Abused:   . Sexually Abused:     PHYSICAL EXAM: BP 185/78, P 75, O2 98, TH 5'5", WT 169 General: No acute distress.  Patient appears well-groomed.   Head:  Normocephalic/atraumatic Eyes:  Fundi examined  but not visualized Neck: supple, no paraspinal tenderness, full range of motion Heart:  Regular rate and rhythm Lungs:  Clear to auscultation bilaterally Back: No paraspinal tenderness Neurological Exam: alert and oriented to person, place, and time. Attention span and concentration intact, recent and remote memory intact, fund of knowledge intact.  Speech with slightly effortful but not dysarhtric, language intact.  CN II-XII intact. Bulk and tone normal, muscle strength 5/5 throughout.  Sensation to light touch intact.  Deep tendon reflexes 1+ throughout.  Finger to nose testing intact.  Unsteady wide-based gait.  IMPRESSION: 1.  Recurrence of expressive aphasia with reported right sided weakness.  MRI negative for stroke.  Would still treat/optimize management for secondary stroke prevention 2.  Basilar artery stenosis, incidental finding 3.  Hypertension 4.  Type 2 diabetes mellitus  PLAN: 1.  Plavix 75mg  daily for secondary stroke prevention. 2.  Repeat lipid panel.  May need to increase  pravastatin to 80mg  daily if it remains in the 130s-140s. (LDL goal less than 70) 3.  Optimize glycemic control (Hgb A1c goal less than 7) 4.  Optimize blood pressure control (follow up with PCP) 5.  Follow up in 6 months.  , DO  CC: , MD

## 2020-05-14 ENCOUNTER — Encounter: Payer: Self-pay | Admitting: Neurology

## 2020-05-14 ENCOUNTER — Ambulatory Visit (INDEPENDENT_AMBULATORY_CARE_PROVIDER_SITE_OTHER): Payer: Medicare PPO | Admitting: Neurology

## 2020-05-14 ENCOUNTER — Other Ambulatory Visit: Payer: Self-pay

## 2020-05-14 VITALS — BP 185/78 | HR 75 | Ht 65.0 in | Wt 169.0 lb

## 2020-05-14 DIAGNOSIS — E119 Type 2 diabetes mellitus without complications: Secondary | ICD-10-CM

## 2020-05-14 DIAGNOSIS — E785 Hyperlipidemia, unspecified: Secondary | ICD-10-CM

## 2020-05-14 DIAGNOSIS — I10 Essential (primary) hypertension: Secondary | ICD-10-CM

## 2020-05-14 DIAGNOSIS — I639 Cerebral infarction, unspecified: Secondary | ICD-10-CM

## 2020-05-14 DIAGNOSIS — I651 Occlusion and stenosis of basilar artery: Secondary | ICD-10-CM

## 2020-05-14 DIAGNOSIS — Z794 Long term (current) use of insulin: Secondary | ICD-10-CM

## 2020-11-16 NOTE — Progress Notes (Signed)
NEUROLOGY FOLLOW UP OFFICE NOTE  ELDENA DEDE [id number]   Subjective:  Catherine Vazquez is a 87 year old right-handed female with hypertension, CHF and diabetes who follows up for stroke.. She is accompanied by her daughter-in-law who supplements history.  UPDATE: Current medications: pravastatin 40mg ; losartan 100mg ; carvedilol 6.25mg  twice daily.   She reportedly ran out of Plavix and never had it refilled.  No recurrence of acute slurred speech or word-finding episodes.    HISTORY: In September 2018, Ms Juarez exhibited slurred speech with some word-finding difficulty but mostly difficulty producing words. She did not exhibit facial droop, visual disturbance, ataxia or unilateral numbness or weakness. CT of head without contrast performed on 06/01/17 was personally reviewed and revealed atrophy and chronic small vessel ischemic changes but no acute/subacute ischemic infarct, bleed or mass lesion. She immediately started speech therapy. Her symptoms have improved, although she still has some word-finding difficulty but mostly difficulty producing words. It also takes her longer to read a paragraph. She was taking ASA 81mg  daily, which she was taking prior to this event.   Her daughter reported recurrence of slurred speech in October or November 2020. She reports increased trouble getting words out. She reports bilateral arm and leg weakness but appears to be worse on the right side. No progression since then. In April 2021, she had a vivid dream and fell out of bed. She was unable to stand up and reportedly was on the floor for 5 hours as she couldn't reach the phone. EMS arrived and she declined ED visit. Due to another possible stroke, she underwent stroke workup: 01/01/2020 MRI BRAIN WO:  Brain imaging does not show any acute or subacute infarction. Chronic small-vessel ischemic changes of the hemispheric white matter. Old right frontal cortical and subcortical  infarctions. 01/01/2020 MRA HEAD & NECK:  Neck MR angiography does not show any carotid bifurcation stenosis or irregularity. No vertebral disease suspected in the neck.  Intracranial MR angiography does not show any anterior circulation large or medium vessel occlusion or correctable proximal stenosis. There is some distal vessel atherosclerotic irregularity. 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 Intracranial posterior circulation shows a severely diseased distal left vertebral artery which is either occluded or gives minimal supply to the basilar. The basilar artery shows serial stenoses in the proximal to midportion.  Pronounced irregularity is noted within the right PCA branch vessels. 01/02/2020 ECHOCARDIOGRAM:  EF 60-65%.  No thrombus or atrial level shunt.   She uses a cane to ambulate but has not had any falls. She feels steady on her feet with the cane. She also has longstanding chronic dizziness.  PAST MEDICAL HISTORY: Past Medical History:  Diagnosis Date  . Arthritis   . CHF (congestive heart failure) (HCC)   . Diabetes mellitus without complication (HCC)   . Hypertension     MEDICATIONS: Current Outpatient Medications on File Prior to Visit  Medication Sig Dispense Refill  . acetaminophen (TYLENOL) 500 MG tablet Take 500-1,000 mg by mouth every 6 (six) hours as needed (for pain). (Patient not taking: Reported on 04/07/2020)    . acetaminophen-codeine (TYLENOL #3) 300-30 MG tablet Take 1 tablet by mouth every 6 (six) hours as needed for moderate pain or severe pain. (Patient not taking: Reported on 04/07/2020) 15 tablet 0  . aspirin EC 81 MG tablet Take 81 mg by mouth  daily. (Patient not taking: Reported on 04/07/2020)    . carvedilol (COREG) 6.25 MG tablet Take 6.25 mg by mouth 2 (two) times daily with a meal. (Patient not taking:  Reported on 04/07/2020)    . clopidogrel (PLAVIX) 75 MG tablet Take 1 tablet (75 mg total) by mouth daily. 30 tablet 5  . cyanocobalamin 1000 MCG tablet Take 1,000 mcg by mouth daily. (Patient not taking: Reported on 04/07/2020)    . famotidine (PEPCID) 20 MG tablet Take 1 tablet (20 mg total) by mouth daily. 30 tablet 0  . lidocaine (XYLOCAINE) 5 % ointment Apply 1 application topically as needed for mild pain or moderate pain.    Marland Kitchen losartan (COZAAR) 100 MG tablet Take 100 mg by mouth daily. (Patient not taking: Reported on 04/07/2020)    . Misc Natural Products (OSTEO BI-FLEX/5-LOXIN ADVANCED PO) Take 1 tablet by mouth 2 (two) times daily.     Marland Kitchen OVER THE COUNTER MEDICATION Take 1 capsule by mouth daily. The ultimate - Healthy longevity supplement.    . pravastatin (PRAVACHOL) 40 MG tablet Take 1 tablet (40 mg total) by mouth daily. 90 tablet 0  . pyridOXINE (VITAMIN B-6) 100 MG tablet Take 100 mg by mouth daily.     No current facility-administered medications on file prior to visit.    ALLERGIES: No Known Allergies  FAMILY HISTORY: Family History  Family history unknown: Yes    SOCIAL HISTORY: Social History   Socioeconomic History  . Marital status: Married    Spouse name: Not on file  . Number of children: Not on file  . Years of education: Not on file  . Highest education level: Not on file  Occupational History  . Not on file  Tobacco Use  . Smoking status: Never Smoker  . Smokeless tobacco: Never Used  Substance and Sexual Activity  . Alcohol use: No  . Drug use: No  . Sexual activity: Not on file  Other Topics Concern  . Not on file  Social History Narrative  . Not on file   Social Determinants of Health   Financial Resource Strain: Not on file  Food Insecurity: Not on file  Transportation Needs: Not on file  Physical Activity: Not on file  Stress: Not on file  Social Connections: Not on file  Intimate Partner Violence: Not on file     Objective:  Blood  pressure 122/74, pulse (!) 110, resp. rate 18, height 5\' 4"  (1.626 m), weight 172 lb (78 kg), SpO2 (!) 9 %. General: No acute distress.  Patient appears well-groomed.   Head:  Normocephalic/atraumatic Eyes:  Fundi examined but not visualized Neck: supple, no paraspinal tenderness, full range of motion Heart:  Regular rate and rhythm Lungs:  Clear to auscultation bilaterally Back: No paraspinal tenderness Neurological Exam: alert and oriented to person, place, and time. Speech fluent and not dysarthric, language intact.  CN II-XII intact. Bulk and tone normal, muscle strength 5/5 throughout.  Sensation to light touch intact.  Deep tendon reflexes 1+ throughout.  Finger to nose testing intact.  Unsteady gait.    Assessment/Plan:   1.  Recurrence of expressive aphasia with reported right sided weakness.  Possible MRI-negative stroke 2.  Basilar artery stenosis, incidental finding 3.  Hypertension 4.  Type 2 diabetes mellitus  1.  Secondary stroke prevention as managed by PCP: - Restart Plavix 75mg  daily - subsequent refills should be managed by PCP - Statin therapy.  LDL goal less than 70 - Glycemic control.  Hgb A1c goal less than 7 - Blood pressure control 2.  Follow up one year  Shon Millet, DO  CC: Leilani Able, MD

## 2020-11-17 ENCOUNTER — Encounter: Payer: Self-pay | Admitting: Neurology

## 2020-11-17 ENCOUNTER — Ambulatory Visit: Payer: Medicare PPO | Admitting: Neurology

## 2020-11-17 ENCOUNTER — Other Ambulatory Visit: Payer: Self-pay

## 2020-11-17 VITALS — BP 122/74 | HR 110 | Resp 18 | Ht 64.0 in | Wt 172.0 lb

## 2020-11-17 DIAGNOSIS — I10 Essential (primary) hypertension: Secondary | ICD-10-CM | POA: Diagnosis not present

## 2020-11-17 DIAGNOSIS — I651 Occlusion and stenosis of basilar artery: Secondary | ICD-10-CM

## 2020-11-17 DIAGNOSIS — E119 Type 2 diabetes mellitus without complications: Secondary | ICD-10-CM

## 2020-11-17 DIAGNOSIS — Z794 Long term (current) use of insulin: Secondary | ICD-10-CM

## 2020-11-17 DIAGNOSIS — I639 Cerebral infarction, unspecified: Secondary | ICD-10-CM | POA: Diagnosis not present

## 2020-11-17 DIAGNOSIS — E785 Hyperlipidemia, unspecified: Secondary | ICD-10-CM

## 2020-11-17 MED ORDER — CLOPIDOGREL BISULFATE 75 MG PO TABS
75.0000 mg | ORAL_TABLET | Freq: Every day | ORAL | 0 refills | Status: DC
Start: 2020-11-17 — End: 2021-03-10

## 2020-11-17 NOTE — Patient Instructions (Addendum)
1.  Will refill Plavix/clopidogrel but further refills should be ordered by Dr. Pecola Leisure 2.  Follow up with the eye doctor 3.  Follow up with me in one year or sooner if needed.

## 2020-11-19 ENCOUNTER — Other Ambulatory Visit: Payer: Self-pay | Admitting: Neurology

## 2020-11-19 DIAGNOSIS — E785 Hyperlipidemia, unspecified: Secondary | ICD-10-CM

## 2021-01-05 ENCOUNTER — Other Ambulatory Visit: Payer: Self-pay | Admitting: Neurology

## 2021-02-10 ENCOUNTER — Other Ambulatory Visit: Payer: Self-pay | Admitting: Neurology

## 2021-03-10 ENCOUNTER — Other Ambulatory Visit: Payer: Self-pay | Admitting: Neurology

## 2021-04-05 ENCOUNTER — Other Ambulatory Visit: Payer: Self-pay | Admitting: Neurology

## 2021-04-05 NOTE — Telephone Encounter (Signed)
Send to PCP.

## 2021-04-07 NOTE — Telephone Encounter (Signed)
Rx denied. I have called [facility] and inform them to sent refill request to PCP

## 2021-11-16 NOTE — Progress Notes (Signed)
NEUROLOGY FOLLOW UP OFFICE NOTE  FEMALE MASIS [id number]  Assessment/Plan:   Probable MRI-negative stroke presenting as expressive aphasia with right sided weakness Basilar artery stenosis, incidental finding Hypertension Type 2 diabetes mellitus  Secondary stroke prevention as managed by PCP - Will refill Plavix 75mg  daily with 2 additional refills.  PCP should continue management thereafter - Statin. LDL goal less than 70 - Hgb A1c goal less than 7 - Normotensive blood pressure Follow up as needed.  Subjective:  Catherine Vazquez is a 88 year old right-handed female with hypertension, CHF and diabetes who follows up for stroke..  She is accompanied by her daughter-in-law who supplements history.   UPDATE: Current medications:  Pravastatin 40mg ; losartan 100mg ; carvedilol 6.25mg  twice daily.     Overall, feeling okay.  She hasn't been on Plavix since September.  Was unable to get it refilled.     HISTORY: In September 2018, Ms Catherine Vazquez exhibited slurred speech with some word-finding difficulty but mostly difficulty producing words.  She did not exhibit facial droop, visual disturbance, ataxia or unilateral numbness or weakness.  CT of head without contrast performed on 06/01/17 was personally reviewed and revealed atrophy and chronic small vessel ischemic changes but no acute/subacute ischemic infarct, bleed or mass lesion.  She immediately started speech therapy.  Her symptoms have improved, although she still has some word-finding difficulty but mostly difficulty producing words.  It also takes her longer to read a paragraph.  She was taking ASA 81mg  daily, which she was taking prior to this event.     Her daughter reported recurrence of slurred speech in October or November 2020. She reports increased trouble getting words out.  She reports bilateral arm and leg weakness but appears to be worse on the right side. No progression since then.  In April 2021, she had a vivid dream  and fell out of bed.  She was unable to stand up and reportedly was on the floor for 5 hours as she couldn't reach the phone.  EMS arrived and she declined ED visit.  Due to another possible stroke, she underwent stroke workup: 01/01/2020 MRI BRAIN WO:  Brain imaging does not show any acute or subacute infarction. Chronic small-vessel ischemic changes of the hemispheric white matter. Old right frontal cortical and subcortical infarctions. 01/01/2020 MRA HEAD & NECK:  Neck MR angiography does not show any carotid bifurcation stenosis or irregularity. No vertebral disease suspected in the neck.  Intracranial MR angiography does not show any anterior circulation large or medium vessel occlusion or correctable proximal stenosis. There is some distal vessel atherosclerotic irregularity. 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 Intracranial posterior circulation shows a severely diseased distal left vertebral artery which is either occluded or gives minimal supply to the basilar. The basilar artery shows serial stenoses in the proximal to midportion.  Pronounced irregularity is noted within the right PCA branch vessels. 01/02/2020 ECHOCARDIOGRAM:  EF 60-65%.  No thrombus or atrial level shunt.     She uses a cane to ambulate but has not had any falls.  She feels steady on her feet with the cane.  She also has longstanding chronic dizziness.  PAST MEDICAL HISTORY: Past Medical History:  Diagnosis Date   Arthritis    CHF (congestive heart failure) ([place])    Diabetes mellitus without complication ([place])    Hypertension  MEDICATIONS: Current Outpatient Medications on File Prior to Visit  Medication Sig Dispense Refill   acetaminophen (TYLENOL) 500 MG tablet Take 500-1,000 mg by mouth every 6 (six) hours as needed (for pain). (Patient not taking: No sig  reported)     acetaminophen-codeine (TYLENOL #3) 300-30 MG tablet Take 1 tablet by mouth every 6 (six) hours as needed for moderate pain or severe pain. (Patient not taking: No sig reported) 15 tablet 0   aspirin EC 81 MG tablet Take 81 mg by mouth daily. (Patient not taking: No sig reported)     carvedilol (COREG) 6.25 MG tablet Take 6.25 mg by mouth 2 (two) times daily with a meal.     clopidogrel (PLAVIX) 75 MG tablet Take 1 tablet by mouth once daily 30 tablet 0   cyanocobalamin 1000 MCG tablet Take 1,000 mcg by mouth daily. (Patient not taking: No sig reported)     famotidine (PEPCID) 20 MG tablet Take 1 tablet (20 mg total) by mouth daily. (Patient not taking: Reported on 11/17/2020) 30 tablet 0   lidocaine (XYLOCAINE) 5 % ointment Apply 1 application topically as needed for mild pain or moderate pain. (Patient not taking: Reported on 11/17/2020)     losartan (COZAAR) 100 MG tablet Take 100 mg by mouth daily.     Misc Natural Products (OSTEO BI-FLEX/5-LOXIN ADVANCED PO) Take 1 tablet by mouth 2 (two) times daily.      OVER THE COUNTER MEDICATION Take 1 capsule by mouth daily. The ultimate - Healthy longevity supplement. (Patient not taking: Reported on 11/17/2020)     pravastatin (PRAVACHOL) 40 MG tablet Take 1 tablet (40 mg total) by mouth daily. 90 tablet 0   pyridOXINE (VITAMIN B-6) 100 MG tablet Take 100 mg by mouth daily. (Patient not taking: Reported on 11/17/2020)     No current facility-administered medications on file prior to visit.    ALLERGIES: No Known Allergies  FAMILY HISTORY: Family History  Family history unknown: Yes      Objective:  Blood pressure 120/68, pulse (!) 52, height 5\' 5"  (1.651 m), weight 167 lb (75.8 kg), SpO2 98 %. General: No acute distress.  Patient appears well-groomed.   Head:  Normocephalic/atraumatic Eyes:  Fundi examined but not visualized Neck: supple, no paraspinal tenderness, full range of motion Heart:  Regular rate and rhythm Lungs:   Clear to auscultation bilaterally Back: No paraspinal tenderness Neurological Exam: alert and oriented to person, place, and time. Speech fluent and not dysarthric, language intact.  CN II-XII intact. Bulk and tone normal, muscle strength 5/5 throughout.  Sensation to light touch intact.  Deep tendon reflexes 1+ throughout.  Finger to nose testing intact.  Unsteady gait. Uses walker.   Metta Clines, DO  CC: Lin Landsman, MD

## 2021-11-17 ENCOUNTER — Ambulatory Visit: Payer: Medicare PPO | Admitting: Neurology

## 2021-11-17 ENCOUNTER — Other Ambulatory Visit: Payer: Self-pay

## 2021-11-17 ENCOUNTER — Encounter: Payer: Self-pay | Admitting: Neurology

## 2021-11-17 VITALS — BP 120/68 | HR 52 | Ht 65.0 in | Wt 167.0 lb

## 2021-11-17 DIAGNOSIS — Z794 Long term (current) use of insulin: Secondary | ICD-10-CM

## 2021-11-17 DIAGNOSIS — I651 Occlusion and stenosis of basilar artery: Secondary | ICD-10-CM | POA: Diagnosis not present

## 2021-11-17 DIAGNOSIS — E119 Type 2 diabetes mellitus without complications: Secondary | ICD-10-CM | POA: Diagnosis not present

## 2021-11-17 DIAGNOSIS — I10 Essential (primary) hypertension: Secondary | ICD-10-CM | POA: Diagnosis not present

## 2021-11-17 DIAGNOSIS — I639 Cerebral infarction, unspecified: Secondary | ICD-10-CM

## 2021-11-17 DIAGNOSIS — E785 Hyperlipidemia, unspecified: Secondary | ICD-10-CM

## 2021-11-17 MED ORDER — CLOPIDOGREL BISULFATE 75 MG PO TABS: 75.0000 mg | ORAL_TABLET | Freq: Every day | ORAL | 2 refills | Status: DC

## 2021-11-17 NOTE — Patient Instructions (Signed)
Refilled the clopidogrel (Plavix) with refills.  Your PCP should refill thereafter Continue statin and blood pressure control Follow up as needed

## 2024-08-20 ENCOUNTER — Emergency Department (HOSPITAL_COMMUNITY)

## 2024-08-20 ENCOUNTER — Encounter (HOSPITAL_COMMUNITY): Payer: Self-pay

## 2024-08-20 ENCOUNTER — Inpatient Hospital Stay (HOSPITAL_COMMUNITY)
Admission: EM | Admit: 2024-08-20 | Discharge: 2024-08-23 | DRG: 281 | Disposition: A | Discharge status: Home / Self Care | Attending: Family Medicine | Admitting: Family Medicine

## 2024-08-20 ENCOUNTER — Other Ambulatory Visit: Payer: Self-pay

## 2024-08-20 ENCOUNTER — Inpatient Hospital Stay (HOSPITAL_COMMUNITY)

## 2024-08-20 DIAGNOSIS — I441 Atrioventricular block, second degree: Secondary | ICD-10-CM | POA: Diagnosis not present

## 2024-08-20 DIAGNOSIS — E119 Type 2 diabetes mellitus without complications: Secondary | ICD-10-CM | POA: Diagnosis present

## 2024-08-20 DIAGNOSIS — R918 Other nonspecific abnormal finding of lung field: Secondary | ICD-10-CM | POA: Diagnosis present

## 2024-08-20 DIAGNOSIS — I452 Bifascicular block: Secondary | ICD-10-CM | POA: Diagnosis present

## 2024-08-20 DIAGNOSIS — I10 Essential (primary) hypertension: Secondary | ICD-10-CM | POA: Diagnosis present

## 2024-08-20 DIAGNOSIS — E785 Hyperlipidemia, unspecified: Secondary | ICD-10-CM | POA: Diagnosis present

## 2024-08-20 DIAGNOSIS — Z8673 Personal history of transient ischemic attack (TIA), and cerebral infarction without residual deficits: Secondary | ICD-10-CM

## 2024-08-20 DIAGNOSIS — R911 Solitary pulmonary nodule: Secondary | ICD-10-CM | POA: Diagnosis present

## 2024-08-20 DIAGNOSIS — E782 Mixed hyperlipidemia: Secondary | ICD-10-CM

## 2024-08-20 DIAGNOSIS — I214 Non-ST elevation (NSTEMI) myocardial infarction: Principal | ICD-10-CM | POA: Diagnosis present

## 2024-08-20 DIAGNOSIS — Z515 Encounter for palliative care: Secondary | ICD-10-CM

## 2024-08-20 DIAGNOSIS — J9611 Chronic respiratory failure with hypoxia: Secondary | ICD-10-CM | POA: Diagnosis present

## 2024-08-20 DIAGNOSIS — Z7902 Long term (current) use of antithrombotics/antiplatelets: Secondary | ICD-10-CM

## 2024-08-20 DIAGNOSIS — I110 Hypertensive heart disease with heart failure: Secondary | ICD-10-CM | POA: Diagnosis present

## 2024-08-20 DIAGNOSIS — Z9981 Dependence on supplemental oxygen: Secondary | ICD-10-CM

## 2024-08-20 DIAGNOSIS — Z7982 Long term (current) use of aspirin: Secondary | ICD-10-CM

## 2024-08-20 DIAGNOSIS — I252 Old myocardial infarction: Secondary | ICD-10-CM

## 2024-08-20 DIAGNOSIS — I5030 Unspecified diastolic (congestive) heart failure: Secondary | ICD-10-CM | POA: Diagnosis not present

## 2024-08-20 DIAGNOSIS — R053 Chronic cough: Secondary | ICD-10-CM | POA: Diagnosis present

## 2024-08-20 DIAGNOSIS — Z79899 Other long term (current) drug therapy: Secondary | ICD-10-CM

## 2024-08-20 DIAGNOSIS — Z66 Do not resuscitate: Secondary | ICD-10-CM | POA: Diagnosis present

## 2024-08-20 DIAGNOSIS — Z90710 Acquired absence of both cervix and uterus: Secondary | ICD-10-CM

## 2024-08-20 DIAGNOSIS — I5032 Chronic diastolic (congestive) heart failure: Secondary | ICD-10-CM | POA: Diagnosis present

## 2024-08-20 LAB — CBC
HCT: 42.1 % (ref 36.0–46.0)
Hemoglobin: 13.8 g/dL (ref 12.0–15.0)
MCH: 31.3 pg (ref 26.0–34.0)
MCHC: 32.8 g/dL (ref 30.0–36.0)
MCV: 95.5 fL (ref 80.0–100.0)
Platelets: 261 K/uL (ref 150–400)
RBC: 4.41 MIL/uL (ref 3.87–5.11)
RDW: 13.3 % (ref 11.5–15.5)
WBC: 5.1 K/uL (ref 4.0–10.5)
nRBC: 0 % (ref 0.0–0.2)

## 2024-08-20 LAB — COMPREHENSIVE METABOLIC PANEL WITH GFR
ALT: 16 U/L (ref 0–44)
AST: 71 U/L — ABNORMAL HIGH (ref 15–41)
Albumin: 3.4 g/dL — ABNORMAL LOW (ref 3.5–5.0)
Alkaline Phosphatase: 67 U/L (ref 38–126)
Anion gap: 11 (ref 5–15)
BUN: 14 mg/dL (ref 8–23)
CO2: 25 mmol/L (ref 22–32)
Calcium: 9 mg/dL (ref 8.9–10.3)
Chloride: 103 mmol/L (ref 98–111)
Creatinine, Ser: 0.86 mg/dL (ref 0.44–1.00)
GFR, Estimated: >60 mL/min (ref >60)
Glucose, Bld: 136 mg/dL — ABNORMAL HIGH (ref 70–99)
Potassium: 4.1 mmol/L (ref 3.5–5.1)
Sodium: 139 mmol/L (ref 135–145)
Total Bilirubin: 0.8 mg/dL (ref 0.0–1.2)
Total Protein: 6.5 g/dL (ref 6.5–8.1)

## 2024-08-20 LAB — TROPONIN I (HIGH SENSITIVITY)
Troponin I (High Sensitivity): 4672 ng/L (ref <18)
Troponin I (High Sensitivity): 6481 ng/L (ref <18)
Troponin I (High Sensitivity): 7370 ng/L (ref <18)

## 2024-08-20 LAB — BRAIN NATRIURETIC PEPTIDE: B Natriuretic Peptide: 485 pg/mL — ABNORMAL HIGH (ref 0.0–100.0)

## 2024-08-20 LAB — GLUCOSE, CAPILLARY: Glucose-Capillary: 164 mg/dL — ABNORMAL HIGH (ref 70–99)

## 2024-08-20 MED ORDER — ACETAMINOPHEN 650 MG RE SUPP: 650.0000 mg | Freq: Four times a day (QID) | RECTAL | Status: DC | PRN

## 2024-08-20 MED ORDER — ASPIRIN 81 MG PO TBEC
81.0000 mg | DELAYED_RELEASE_TABLET | Freq: Every day | ORAL | Status: DC
  Administered 2024-08-20 – 2024-08-23 (×4): 81 mg via ORAL
  Filled 2024-08-20 (×4): qty 1

## 2024-08-20 MED ORDER — IOHEXOL 350 MG/ML SOLN
75.0000 mL | Freq: Once | INTRAVENOUS | Status: AC | PRN
  Administered 2024-08-20: 75 mL via INTRAVENOUS

## 2024-08-20 MED ORDER — ONDANSETRON HCL 4 MG PO TABS: 4.0000 mg | ORAL_TABLET | Freq: Four times a day (QID) | ORAL | Status: DC | PRN

## 2024-08-20 MED ORDER — ATORVASTATIN CALCIUM 40 MG PO TABS
40.0000 mg | ORAL_TABLET | Freq: Every day | ORAL | Status: DC
  Administered 2024-08-20 – 2024-08-23 (×4): 40 mg via ORAL
  Filled 2024-08-20 (×4): qty 1

## 2024-08-20 MED ORDER — OXYCODONE HCL 5 MG PO TABS: 5.0000 mg | ORAL_TABLET | Freq: Four times a day (QID) | ORAL | Status: DC | PRN

## 2024-08-20 MED ORDER — ALBUTEROL SULFATE (2.5 MG/3ML) 0.083% IN NEBU: 2.5000 mg | INHALATION_SOLUTION | RESPIRATORY_TRACT | Status: DC | PRN

## 2024-08-20 MED ORDER — SODIUM CHLORIDE 0.9 % IV SOLN: 250.0000 mL | INTRAVENOUS | Status: AC | PRN

## 2024-08-20 MED ORDER — ACETAMINOPHEN 325 MG PO TABS: 650.0000 mg | ORAL_TABLET | Freq: Four times a day (QID) | ORAL | Status: DC | PRN

## 2024-08-20 MED ORDER — INSULIN ASPART 100 UNIT/ML IJ SOLN
0.0000 [IU] | Freq: Three times a day (TID) | INTRAMUSCULAR | Status: DC
  Administered 2024-08-21 (×2): 1 [IU] via SUBCUTANEOUS
  Administered 2024-08-22: 3 [IU] via SUBCUTANEOUS
  Filled 2024-08-20: qty 3
  Filled 2024-08-20: qty 2
  Filled 2024-08-20 (×2): qty 1

## 2024-08-20 MED ORDER — ASPIRIN 325 MG PO TBEC
325.0000 mg | DELAYED_RELEASE_TABLET | Freq: Once | ORAL | Status: AC
  Administered 2024-08-20: 325 mg via ORAL
  Filled 2024-08-20: qty 1

## 2024-08-20 MED ORDER — SODIUM CHLORIDE 0.9% FLUSH
3.0000 mL | Freq: Two times a day (BID) | INTRAVENOUS | Status: DC
  Administered 2024-08-21 – 2024-08-23 (×5): 3 mL via INTRAVENOUS

## 2024-08-20 MED ORDER — MORPHINE SULFATE (PF) 2 MG/ML IV SOLN: 1.0000 mg | INTRAVENOUS | Status: DC | PRN

## 2024-08-20 MED ORDER — POLYETHYLENE GLYCOL 3350 17 G PO PACK: 17.0000 g | PACK | Freq: Every day | ORAL | Status: DC | PRN

## 2024-08-20 MED ORDER — ONDANSETRON HCL 4 MG/2ML IJ SOLN: 4.0000 mg | Freq: Four times a day (QID) | INTRAMUSCULAR | Status: DC | PRN

## 2024-08-20 MED ORDER — METOPROLOL TARTRATE 25 MG PO TABS: 12.5000 mg | ORAL_TABLET | Freq: Two times a day (BID) | ORAL | Status: DC

## 2024-08-20 MED ORDER — SODIUM CHLORIDE 0.9% FLUSH
3.0000 mL | INTRAVENOUS | Status: DC | PRN
Start: 2024-08-20 — End: 2024-08-23

## 2024-08-20 MED ORDER — LATANOPROST 0.005 % OP SOLN
1.0000 [drp] | Freq: Every day | OPHTHALMIC | Status: DC
  Administered 2024-08-21 – 2024-08-22 (×2): 1 [drp] via OPHTHALMIC
  Filled 2024-08-20 (×2): qty 2.5

## 2024-08-20 MED ORDER — HEPARIN (PORCINE) 25000 UT/250ML-% IV SOLN
850.0000 [IU]/h | INTRAVENOUS | Status: DC
  Administered 2024-08-20: 850 [IU]/h via INTRAVENOUS
  Filled 2024-08-20: qty 250

## 2024-08-20 NOTE — H&P (Addendum)
 History and Physical    Patient: Catherine Vazquez FMW:[id number] DOB: 02/22/1925 DOA: 08/20/2024 DOS: the patient was seen and examined on 08/20/2024 PCP: Ilah Crigler, MD  Patient coming from: Home  Chief Complaint:  Chief Complaint  Patient presents with   Chest Pain   HPI: Catherine Vazquez is a 89 y.o. female with medical history significant of prior CVA, HTN, HLD, DM-2, chronic hypoxic respiratory failure on 2 L of oxygen at baseline who presented to the ED with left-sided chest pain.  Per patient-she has had 2 episodes of chest pain so far, 1 was the day before yesterday and 1 was yesterday.  Both were sudden in onset-lasting around 10-15 minutes, without any particular radiation, they were associated with several episodes of nausea/vomiting and mild worsening of chronic shortness of breath.  This morning she continued to have mild shortness of breath/dyspnea at rest (more than baseline).  She does have some amount of chronic cough that has been persistent for the past several weeks.  She subsequently presented to the ED where she was found to have significantly elevated troponin levels, she was started on heparin  infusion-cardiology was consulted and hospitalist service was asked to admit this patient for further evaluation and treatment.  No fever/headache + Chest pain + SOB + Nausea/vomiting No abdominal pain No diarrhea No dysuria No hematuria   Review of Systems: As mentioned in the history of present illness. All other systems reviewed and are negative. Past Medical History:  Diagnosis Date   Arthritis    CHF (congestive heart failure) (HCC)    Diabetes mellitus without complication (HCC)    Hypertension    Past Surgical History:  Procedure Laterality Date   ABDOMINAL HYSTERECTOMY     BUNIONECTOMY     CARPAL TUNNEL RELEASE     EYE SURGERY     KNEE ARTHROSCOPY     ROTATOR CUFF REPAIR     TONSILLECTOMY     Social History:  reports that she has never smoked.  She has never used smokeless tobacco. She reports that she does not drink alcohol and does not use drugs.  No Known Allergies  Family History  Family history unknown: Yes    Prior to Admission medications   Medication Sig Start Date End Date Taking? Authorizing Provider  acetaminophen  (TYLENOL ) 500 MG tablet Take 500-1,000 mg by mouth every 6 (six) hours as needed (for pain).   Yes [provider]  latanoprost  (XALATAN ) 0.005 % ophthalmic solution Place 1 drop into both eyes nightly. 03/17/21  Yes [provider]    Physical Exam: Vitals:   08/20/24 1501 08/20/24 1514 08/20/24 1550 08/20/24 1625  BP: (!) 144/82 (!) 148/51  (!) 150/73  Pulse: (!) 50 (!) 57  (!) 58  Resp: 15 16  17   Temp: 97.6 F (36.4 C) 97.7 F (36.5 C)    TempSrc: Oral Oral    SpO2:  100%  100%  Weight:   72.7 kg   Height:   5' 5 (1.651 m)    Gen Exam:Alert awake-not in any distress HEENT:atraumatic, normocephalic Chest: B/L clear to auscultation anteriorly CVS:S1S2 regular Abdomen:soft non tender, non distended Extremities:no edema Neurology: Non focal Skin: no rash  Data Reviewed:    Latest Ref Rng & Units 08/20/2024   11:38 AM 04/07/2020   11:11 AM 01/12/2018   12:29 PM  CBC  WBC 4.0 - 10.5 K/uL 5.1  4.6  8.4   Hemoglobin 12.0 - 15.0 g/dL 86.1  86.5  13.1  Hematocrit 36.0 - 46.0 % 42.1  41.2  39.4   Platelets 150 - 400 K/uL 261  254  260         Latest Ref Rng & Units 08/20/2024   11:38 AM 04/07/2020   11:11 AM 01/12/2018   12:29 PM  BMP  Glucose 70 - 99 mg/dL 863  769  835   BUN 8 - 23 mg/dL 14  14  13    Creatinine 0.44 - 1.00 mg/dL 9.13  9.15  9.00   Sodium 135 - 145 mmol/L 139  141  141   Potassium 3.5 - 5.1 mmol/L 4.1  4.2  3.8   Chloride 98 - 111 mmol/L 103  105  105   CO2 22 - 32 mmol/L 25  28  25    Calcium  8.9 - 10.3 mg/dL 9.0  9.5  9.4      Assessment and Plan: Non-STEMI Currently chest pain-free/comfortable Continue aspirin /IV  heparin /statin/beta-blocker Obtain echo Per Grandson at bedside-patient nor the family wants to pursue invasive treatment including LHC. Cardiology following-await recommendations  Abnormal chest x-ray Unclear if this is artifactual or nodule/bronchopneumonia (see official x-ray report) Obtain noncontrast CT chest to delineate further.  Chronic hypoxic respiratory failure on home O2-2 liters Unclear reason-no documented history of COPD-does have HFpEF Continue O2 as previous.  History of CVA No longer on antiplatelet agents Currently on aspirin -for non-STEMI  HTN Per grandson-no longer on antihypertensives-stopped approximately 2 years back (previously on Coreg/losartan ) On low-dose beta-blocker for non-STEMI Follow/optimize  HLD Previously on pravastatin -no longer on it x 2 years-will start Lipitor for non-STEMI Obtain lipid panel in AM.  DM-2 Unclear if she was on medications in the past Placed on SSI Check A1c   Advance Care Planning:   Code Status: Full Code (spoke with grandson-full code for now)  Consults: Cards  Family Communication: Grandson-Manley-at bedside  Severity of Illness: The appropriate patient status for this patient is OBSERVATION. Observation status is judged to be reasonable and necessary in order to provide the required intensity of service to ensure the patient's safety. The patient's presenting symptoms, physical exam findings, and initial radiographic and laboratory data in the context of their medical condition is felt to place them at decreased risk for further clinical deterioration. Furthermore, it is anticipated that the patient will be medically stable for discharge from the hospital within 2 midnights of admission.   Author: Donalda Applebaum, MD 08/20/2024 5:05 PM  For on call review [email or web address].

## 2024-08-20 NOTE — ED Provider Notes (Addendum)
 [place] EMERGENCY DEPARTMENT AT [facility] Provider Note   CSN: [id number] Arrival date & time: 08/20/24  1108     Patient presents with: Chest Pain   KALEENA CORROW is a 89 y.o. female.   HPI   89 year old female with medical history significant for arthritis, CHF (last EF 60 to 65% with diastolic dysfunction), hypoxic respiratory failure on 2 L O2 at baseline, diabetes mellitus without complication, HTN presenting to the emergency department with a chief complaint of chest pain.  The patient states that she has had a cough and shortness of breath for the past week.  Yesterday she had sudden onset sharp left-sided chest pain that lasted from 15 minutes.  Had persistent shortness of breath, endorses nausea, has had some episodes of NBNB emesis.  She is currently asymptomatic with just mild shortness of breath and dyspnea at rest.  She has had a nonproductive cough that has persisted.  She is not on anticoagulation.  She denies any lower extremity swelling.  No known sick contacts.  No fevers or chills.  Prior to Admission medications   Medication Sig Start Date End Date Taking? Authorizing Provider  latanoprost  (XALATAN ) 0.005 % ophthalmic solution Place 1 drop into both eyes nightly. 03/17/21  Yes [provider]  acetaminophen  (TYLENOL ) 500 MG tablet Take 500-1,000 mg by mouth every 6 (six) hours as needed (for pain).    [provider]  clopidogrel  (PLAVIX ) 75 MG tablet Take 1 tablet (75 mg total) by mouth daily. 11/17/21   Skeet Juliene SAUNDERS, DO  lidocaine (XYLOCAINE) 5 % ointment Apply 1 application topically as needed for mild pain or moderate pain.    [provider]  losartan  (COZAAR ) 100 MG tablet Take 100 mg by mouth daily.    [provider]    Allergies: Patient has no known allergies.    Review of Systems  All other systems reviewed and are negative.   Updated Vital Signs BP (!) 148/51 (BP Location: Right Arm)   Pulse (!)  57   Temp 97.7 F (36.5 C) (Oral)   Resp 16   SpO2 100%   Physical Exam Vitals and nursing note reviewed.  Constitutional:      General: She is not in acute distress.    Appearance: She is well-developed.  HENT:     Head: Normocephalic and atraumatic.  Eyes:     Conjunctiva/sclera: Conjunctivae normal.  Cardiovascular:     Rate and Rhythm: Regular rhythm. Bradycardia present.     Pulses: Normal pulses.  Pulmonary:     Effort: Pulmonary effort is normal. No respiratory distress.     Breath sounds: Normal breath sounds.  Abdominal:     Palpations: Abdomen is soft.     Tenderness: There is no abdominal tenderness.  Musculoskeletal:        General: No swelling.     Cervical back: Neck supple.     Right lower leg: No edema.     Left lower leg: No edema.  Skin:    General: Skin is warm and dry.     Capillary Refill: Capillary refill takes less than 2 seconds.  Neurological:     Mental Status: She is alert.  Psychiatric:        Mood and Affect: Mood normal.     (all labs ordered are listed, but only abnormal results are displayed) Labs Reviewed  BRAIN NATRIURETIC PEPTIDE - Abnormal; Notable for the following components:      Result Value  B Natriuretic Peptide 485.0 (*)    All other components within normal limits  COMPREHENSIVE METABOLIC PANEL WITH GFR - Abnormal; Notable for the following components:   Glucose, Bld 136 (*)    Albumin 3.4 (*)    AST 71 (*)    All other components within normal limits  TROPONIN I (HIGH SENSITIVITY) - Abnormal; Notable for the following components:   Troponin I (High Sensitivity) 4,672 (*)    All other components within normal limits  TROPONIN I (HIGH SENSITIVITY) - Abnormal; Notable for the following components:   Troponin I (High Sensitivity) 6,481 (*)    All other components within normal limits  CBC  URINALYSIS, ROUTINE W REFLEX MICROSCOPIC  TROPONIN I (HIGH SENSITIVITY)    EKG: None  Radiology: CT Angio Chest PE W and/or  Wo Contrast Result Date: 08/20/2024 CLINICAL DATA:  Pulmonary embolism (PE) suspected, high prob Central chest pain with shortness of breath and vomiting since yesterday. On home oxygen. EXAM: CT ANGIOGRAPHY CHEST WITH CONTRAST TECHNIQUE: Multidetector CT imaging of the chest was performed using the standard protocol during bolus administration of intravenous contrast. Multiplanar CT image reconstructions and MIPs were obtained to evaluate the vascular anatomy. RADIATION DOSE REDUCTION: This exam was performed according to the departmental dose-optimization program which includes automated exposure control, adjustment of the mA and/or kV according to patient size and/or use of iterative reconstruction technique. CONTRAST:  75mL OMNIPAQUE  IOHEXOL  350 MG/ML SOLN COMPARISON:  Same date chest radiographs.  Chest CTA 11/13/2015. FINDINGS: Cardiovascular: The pulmonary arteries are well opacified with contrast to the level of the segmental branches. There is no evidence of acute pulmonary embolism. Mild aortic and coronary artery atherosclerosis without acute systemic abnormalities. The heart size is stable at the upper limits of normal. Small amount of fluid in the superior pericardial recess, stable. Mediastinum/Nodes: There are no enlarged mediastinal, hilar or axillary lymph nodes. The thyroid gland, trachea and esophagus demonstrate no significant findings. Lungs/Pleura: No pleural effusion or pneumothorax. Mildly progressive scarring and subpleural reticulation at both lung bases. No confluent airspace disease. New small nonspecific 3 mm left upper lobe nodule on image 47/7. No suspicious pulmonary nodules. Upper abdomen: Mild reflux of contrast into the IVC and hepatic veins. No acute findings are demonstrated within the visualized upper abdomen. Musculoskeletal/Chest wall: There is no chest wall mass or suspicious osseous finding. Multilevel spondylosis. Review of the MIP images confirms the above findings.  IMPRESSION: 1. No evidence of acute pulmonary embolism or other acute chest findings. 2. Mildly progressive scarring and subpleural reticulation at both lung bases. 3. New small nonspecific 3 mm left upper lobe pulmonary nodule. Per Fleischner Society Guidelines, if patient is low risk for malignancy, no routine follow-up imaging is recommended. If patient is high risk for malignancy, a non-contrast Chest CT at 12 months is optional. If performed and the nodule is stable at 12 months, no further follow-up is recommended. These guidelines do not apply to immunocompromised patients and patients with cancer. Follow up in patients with significant comorbidities as clinically warranted. For lung cancer screening, adhere to Lung-RADS guidelines. Reference: Radiology. 2017; 284(1):228-43. 4.  Aortic Atherosclerosis (ICD10-I70.0). Electronically Signed   By: Elsie Perone M.D.   On: 08/20/2024 15:24   DG Chest 2 View Result Date: 08/20/2024 CLINICAL DATA:  cp/sob EXAM: CHEST - 2 VIEW COMPARISON:  04/07/2020, 11/13/2015 FINDINGS: Low lung volumes. Subtle, almost nodular airspace opacities in the right mid and right lower lung fields. No pleural effusion or pneumothorax. Mild cardiomegaly. No  acute fracture or destructive lesions. Multilevel thoracic osteophytosis. Right humeral head rotator cuff anchor. IMPRESSION: Subtle, almost nodular airspace opacities in the right mid and right lower lung fields, which may be artifactual from overlapping bony and vascular structures. Alternatively, a developing bronchopneumonia or underlying pulmonary nodules could have this appearance in the correct clinical context. Nonemergent chest CT may be of benefit for further characterization. Electronically Signed   By: Rogelia Myers M.D.   On: 08/20/2024 12:31     .Critical Care  Performed by: Jerrol Agent, MD Authorized by: Jerrol Agent, MD   Critical care provider statement:    Critical care time (minutes):  30    Critical care was time spent personally by me on the following activities:  Development of treatment plan with patient or surrogate, discussions with consultants, evaluation of patient's response to treatment, examination of patient, ordering and review of laboratory studies, ordering and review of radiographic studies, ordering and performing treatments and interventions, pulse oximetry, re-evaluation of patient's condition and review of old charts   Care discussed with: admitting provider      Medications Ordered in the ED  iohexol  (OMNIPAQUE ) 350 MG/ML injection 75 mL (75 mLs Intravenous Contrast Given 08/20/24 1403)  aspirin  EC tablet 325 mg (325 mg Oral Given 08/20/24 1415)    Clinical Course as of 08/20/24 1543  Tue Aug 20, 2024  1253 Troponin I (High Sensitivity)(!!): 5,327 [JL]    Clinical Course User Index [JL] Jerrol Agent, MD                                 Medical Decision Making Amount and/or Complexity of Data Reviewed Labs: ordered. Decision-making details documented in ED Course. Radiology: ordered.  Risk OTC drugs. Prescription drug management. Decision regarding hospitalization.    89 year old female with medical history significant for arthritis, CHF (last EF 60 to 65% with diastolic dysfunction), hypoxic respiratory failure on 2 L O2 at baseline, diabetes mellitus without complication, HTN presenting to the emergency department with a chief complaint of chest pain.  The patient states that she has had a cough and shortness of breath for the past week.  Yesterday she had sudden onset sharp left-sided chest pain that lasted from 15 minutes.  Had persistent shortness of breath, endorses nausea, has had some episodes of NBNB emesis.  She is currently asymptomatic with just mild shortness of breath and dyspnea at rest.  She has had a nonproductive cough that has persisted.  She is not on anticoagulation.  She denies any lower extremity swelling.  No known sick contacts.   No fevers or chills.  On arrival, the patient was afebrile, not tachycardic heart rate 69, subsequently was noted to be bradycardic on cardiac telemetry in the exam room and on EKG, BP 157/94, respirate 16, mildly tachypneic on my evaluation in the exam room with respiratory rates in the 20s, saturating initially 91% on room air and then subsequently 95% on her home O2 via nasal cannula.  Differential diagnose includes PE, ACS, pneumonia, viral infection, pneumothorax.  EKG: Undetermined rhythm, ventricular rate 5 6, appears to be possibly a junctional rhythm, subsequent repeat EKG revealed evidence of potential 2:1 heart block vs 2nd degree heart block, ventricular rate 37, no acute ischemic changes, no STEMI.     CXR: IMPRESSION:  Subtle, almost nodular airspace opacities in the right mid and right  lower lung fields, which may be artifactual from overlapping bony  and vascular structures. Alternatively, a developing  bronchopneumonia or underlying pulmonary nodules could have this  appearance in the correct clinical context. Nonemergent chest CT may  be of benefit for further characterization.   Labs: CMP generally unremarkable, CBC without a leukocytosis or anemia, initial cardiac troponin elevated at 4672, BNP nonspecifically moderately elevated at 485.  In the setting of the elevated cardiac troponin and BNP, nonproductive cough, sharp chest discomfort with associated dyspnea, CTA PE study was ordered to further evaluate for PE.  CTA PE: IMPRESSION:  1. No evidence of acute pulmonary embolism or other acute chest  findings.  2. Mildly progressive scarring and subpleural reticulation at both  lung bases.  3. New small nonspecific 3 mm left upper lobe pulmonary nodule. Per  Fleischner Society Guidelines, if patient is low risk for  malignancy, no routine follow-up imaging is recommended. If patient  is high risk for malignancy, a non-contrast Chest CT at 12 months is  optional.  If performed and the nodule is stable at 12 months, no  further follow-up is recommended. These guidelines do not apply to  immunocompromised patients and patients with cancer. Follow up in  patients with significant comorbidities as clinically warranted. For  lung cancer screening, adhere to Lung-RADS guidelines. Reference:  Radiology. 2017; 284(1):228-43.  4.  Aortic Atherosclerosis (ICD10-I70.0).    In the setting of concern for heart block on EKG, concern for NSTEMI with a climbing cardiac troponin, allergy was consulted, spoke with the cardiology master who recommended medicine admission and cardiology will see in consultation.  Patient reassessed bedside, explained results of diagnostic evaluation at this time to date and explained of need for admission, remains chest pain-free on repeat assessment.  Medicine consulted for admission, full admission pending at time of signout, signout given to Dr. Doretha at [phone].      Final diagnoses:  NSTEMI (non-ST elevated myocardial infarction) [facility])    ED Discharge Orders     None              Jerrol Agent, MD 08/20/24 1754

## 2024-08-20 NOTE — Consult Note (Addendum)
 Cardiology Consultation   Patient ID: Catherine Vazquez MRN: [id number]; DOB: 23-Jun-1925  Admit date: 08/20/2024 Date of Consult: 08/20/2024  PCP:  Ilah Crigler, MD   [facility] Providers Cardiologist:  Alm Clay, MD      Patient Profile: Catherine Vazquez is a 89 y.o. female with a hx of type 2 diabetes, hypertension, prior CVA in 2018 and arthritis who is being seen 08/20/2024 for the evaluation of NSTEMI at the request of Lynwood Moulder MD.  History of Present Illness: Catherine Vazquez is a 89 year old female who per chart review has not previously been seen by cardiology.  Prior echocardiogram on 12/2019 showed normal LVEF of 60 to 65%, G1 DD, normal RV systolic function, normal pulmonary artery systolic pressure, and grossly normal valve function.  Patient presented to the emergency department for sudden onset of left-sided chest pain.  He had elevated high-sensitivity troponins.  Received aspirin  325 mg and was started on IV heparin .  On interview patient and her grandson were present for the interview.  The patient's grandson reported that she has had a worsening memory over the past few years and that he suspects she has Alzheimer's dementia.  The patient's grandson reported that him and his sister are the patient's medical POA.  The patient reported that she had chest pain last week and had some more episodes yesterday.  In addition to chest pain also had vomiting yesterday.  Patient reported that the chest pain is worse when lying flat.  Denies any ongoing chest pain.  Stated that she walks about 200 feet to the mail.  Gets short of breath doing this and has had this dyspnea on exertion for the past 2-3 years.  Is on 2 L of oxygen at home.  Labs showed elevated troponin of 4672 > 6481, elevated proBNP of 4850, potassium of 4.1, removed 139, creatinine of 0.86, albumin of 3.4, WBC count of 5.1, and hemoglobin of 13.8.  Pulmonary CTA showed no acute PE, nonspecific 3 mm  left upper lobe node, no other acute findings, aortic atherosclerosis, and mild coronary artery atherosclerosis.  EKG showed normal sinus rhythm with a rate of 56, left anterior fascicular block, right bundle branch block, lateral T wave flattening, variable pacemaker, and second-degree heart block suspected type II.   Past Medical History:  Diagnosis Date   Arthritis    CHF (congestive heart failure) (HCC)    Diabetes mellitus without complication (HCC)    Hypertension     Past Surgical History:  Procedure Laterality Date   ABDOMINAL HYSTERECTOMY     BUNIONECTOMY     CARPAL TUNNEL RELEASE     EYE SURGERY     KNEE ARTHROSCOPY     ROTATOR CUFF REPAIR     TONSILLECTOMY       Home Medications:  Prior to Admission medications   Medication Sig Start Date End Date Taking? Authorizing Provider  acetaminophen  (TYLENOL ) 500 MG tablet Take 500-1,000 mg by mouth every 6 (six) hours as needed (for pain).   Yes [provider]  latanoprost  (XALATAN ) 0.005 % ophthalmic solution Place 1 drop into both eyes nightly. 03/17/21  Yes [provider]    Scheduled Meds:  aspirin  EC  81 mg Oral Daily   atorvastatin   40 mg Oral Daily   [START ON 08/21/2024] insulin  aspart  0-6 Units Subcutaneous TID WC   latanoprost   1 drop Both Eyes QHS   sodium chloride  flush  3 mL Intravenous Q12H   Continuous Infusions:  sodium chloride      heparin  850 Units/hr (08/20/24 1557)   PRN Meds: sodium chloride , acetaminophen  **OR** acetaminophen , albuterol , morphine  injection, ondansetron  **OR** ondansetron  (ZOFRAN ) IV, oxyCODONE , polyethylene glycol, sodium chloride  flush  Allergies:   No Known Allergies  Social History:   Social History   Socioeconomic History   Marital status: Married    Spouse name: Not on file   Number of children: Not on file   Years of education: Not on file   Highest education level: Not on file  Occupational History   Not on file  Tobacco Use   Smoking  status: Never   Smokeless tobacco: Never  Substance and Sexual Activity   Alcohol use: No   Drug use: No   Sexual activity: Not on file  Other Topics Concern   Not on file  Social History Narrative   Right handed   Drinks sprite   [employer] 2nd floor   Social Drivers of Health   Financial Resource Strain: Not on file  Food Insecurity: Not on file  Transportation Needs: Not on file  Physical Activity: Not on file  Stress: Not on file  Social Connections: Not on file  Intimate Partner Violence: Not on file    Family History:    Family History  Family history unknown: Yes     ROS:  Please see the history of present illness.   All other ROS reviewed and negative.     Physical Exam/Data: Vitals:   08/20/24 1501 08/20/24 1514 08/20/24 1550 08/20/24 1625  BP: (!) 144/82 (!) 148/51  (!) 150/73  Pulse: (!) 50 (!) 57  (!) 58  Resp: 15 16  17   Temp: 97.6 F (36.4 C) 97.7 F (36.5 C)    TempSrc: Oral Oral    SpO2:  100%  100%  Weight:   72.7 kg   Height:   5' 5 (1.651 m)    No intake or output data in the 24 hours ending 08/20/24 1724    08/20/2024    3:50 PM 11/17/2021   10:32 AM 11/17/2020    1:41 PM  Last 3 Weights  Weight (lbs) 160 lb 4.4 oz 167 lb 172 lb  Weight (kg) 72.7 kg 75.751 kg 78.019 kg     Body mass index is 26.67 kg/m.  General:  Well nourished, well developed, in no acute distress.  On 2 L nasal cannula.  Patient had difficulty answering questions such as where she was at. HEENT: normal Neck: no JVD Vascular: No carotid bruits; Distal pulses 2+ bilaterally Cardiac:  normal S1, S2; RRR; no murmur  Lungs:  clear to auscultation bilaterally, no wheezing, rhonchi or rales  Abd: soft, nontender, no hepatomegaly  Ext: no edema Musculoskeletal:  No deformities. Skin: warm and dry  Neuro:   no focal abnormalities noted Psych:  Normal affect   EKG:  The EKG was personally reviewed and demonstrates: showed normal sinus rhythm with a rate of  56, left anterior fascicular block, right bundle branch block, lateral T wave flattening, variable pacemaker, and second-degree heart block suspected type II. Telemetry:  Telemetry was personally reviewed and demonstrates: Suspected second-degree type II heart block could possibly be type I with heart rates in the 40s to 70s.  Relevant CV Studies: Echo pending  Laboratory Data: High Sensitivity Troponin:   Recent Labs  Lab 08/20/24 1138 08/20/24 1327 08/20/24 1548  TROPONINIHS 4,672* 6,481* 7,370*     Chemistry Recent Labs  Lab 08/20/24 1138  NA 139  K 4.1  CL 103  CO2 25  GLUCOSE 136*  BUN 14  CREATININE 0.86  CALCIUM  9.0  GFRNONAA >60  ANIONGAP 11    Recent Labs  Lab 08/20/24 1138  PROT 6.5  ALBUMIN 3.4*  AST 71*  ALT 16  ALKPHOS 67  BILITOT 0.8   Lipids No results for input(s): CHOL, TRIG, HDL, LABVLDL, LDLCALC, CHOLHDL in the last 168 hours.  Hematology Recent Labs  Lab 08/20/24 1138  WBC 5.1  RBC 4.41  HGB 13.8  HCT 42.1  MCV 95.5  MCH 31.3  MCHC 32.8  RDW 13.3  PLT 261   Thyroid No results for input(s): TSH, FREET4 in the last 168 hours.  BNP Recent Labs  Lab 08/20/24 1138  BNP 485.0*    DDimer No results for input(s): DDIMER in the last 168 hours.  Radiology/Studies:  CT Angio Chest PE W and/or Wo Contrast Result Date: 08/20/2024 CLINICAL DATA:  Pulmonary embolism (PE) suspected, high prob Central chest pain with shortness of breath and vomiting since yesterday. On home oxygen. EXAM: CT ANGIOGRAPHY CHEST WITH CONTRAST TECHNIQUE: Multidetector CT imaging of the chest was performed using the standard protocol during bolus administration of intravenous contrast. Multiplanar CT image reconstructions and MIPs were obtained to evaluate the vascular anatomy. RADIATION DOSE REDUCTION: This exam was performed according to the departmental dose-optimization program which includes automated exposure control, adjustment of the mA and/or  kV according to patient size and/or use of iterative reconstruction technique. CONTRAST:  75mL OMNIPAQUE  IOHEXOL  350 MG/ML SOLN COMPARISON:  Same date chest radiographs.  Chest CTA 11/13/2015. FINDINGS: Cardiovascular: The pulmonary arteries are well opacified with contrast to the level of the segmental branches. There is no evidence of acute pulmonary embolism. Mild aortic and coronary artery atherosclerosis without acute systemic abnormalities. The heart size is stable at the upper limits of normal. Small amount of fluid in the superior pericardial recess, stable. Mediastinum/Nodes: There are no enlarged mediastinal, hilar or axillary lymph nodes. The thyroid gland, trachea and esophagus demonstrate no significant findings. Lungs/Pleura: No pleural effusion or pneumothorax. Mildly progressive scarring and subpleural reticulation at both lung bases. No confluent airspace disease. New small nonspecific 3 mm left upper lobe nodule on image 47/7. No suspicious pulmonary nodules. Upper abdomen: Mild reflux of contrast into the IVC and hepatic veins. No acute findings are demonstrated within the visualized upper abdomen. Musculoskeletal/Chest wall: There is no chest wall mass or suspicious osseous finding. Multilevel spondylosis. Review of the MIP images confirms the above findings. IMPRESSION: 1. No evidence of acute pulmonary embolism or other acute chest findings. 2. Mildly progressive scarring and subpleural reticulation at both lung bases. 3. New small nonspecific 3 mm left upper lobe pulmonary nodule. Per Fleischner Society Guidelines, if patient is low risk for malignancy, no routine follow-up imaging is recommended. If patient is high risk for malignancy, a non-contrast Chest CT at 12 months is optional. If performed and the nodule is stable at 12 months, no further follow-up is recommended. These guidelines do not apply to immunocompromised patients and patients with cancer. Follow up in patients with  significant comorbidities as clinically warranted. For lung cancer screening, adhere to Lung-RADS guidelines. Reference: Radiology. 2017; 284(1):228-43. 4.  Aortic Atherosclerosis (ICD10-I70.0). Electronically Signed   By: Elsie Perone M.D.   On: 08/20/2024 15:24   DG Chest 2 View Result Date: 08/20/2024 CLINICAL DATA:  cp/sob EXAM: CHEST - 2 VIEW COMPARISON:  04/07/2020, 11/13/2015 FINDINGS: Low lung volumes. Subtle, almost nodular airspace opacities in  the right mid and right lower lung fields. No pleural effusion or pneumothorax. Mild cardiomegaly. No acute fracture or destructive lesions. Multilevel thoracic osteophytosis. Right humeral head rotator cuff anchor. IMPRESSION: Subtle, almost nodular airspace opacities in the right mid and right lower lung fields, which may be artifactual from overlapping bony and vascular structures. Alternatively, a developing bronchopneumonia or underlying pulmonary nodules could have this appearance in the correct clinical context. Nonemergent chest CT may be of benefit for further characterization. Electronically Signed   By: Rogelia Myers M.D.   On: 08/20/2024 12:31     Assessment and Plan:  PECOLA HAXTON is a 89 y.o. female with a hx of type 2 diabetes, hypertension, prior CVA in 2018 and arthritis who is being seen 08/20/2024 for the evaluation of NSTEMI at the request of Lynwood Moulder MD.  NSTEMI Had episodes of chest pain last week.  Yesterday had chest pain and vomiting.  Reported chest pain was worse laying flat.  Denied any ongoing chest pain.  Patient's grandson reported that she has had a worsening memory and that he suspects she might have Alzheimer's dementia. The patient has not previously seen neurology or psychiatry. Labs showed elevated troponin of 4672 > 6481. EKG showed normal sinus rhythm with a rate of 56, left anterior fascicular block, right bundle branch block, lateral T wave flattening. => The rhythm is actually very  interesting. Received aspirin  325 mg and was started on IV heparin . Start aspirin  81 mg daily. Continue IV heparin  for 48 hours. Echo pending Start Lipitor 40 mg daily Given her advanced age, underlying dementia and lack of ongoing chest pain, I think the best course of action is conservative management with 48 hours of IV heparin  and avoid invasive testing.  Checking a 2D echocardiogram to better understand her EF and potential regional wall motion abnormalities is reasonable.  Would prefer to avoid invasive procedures, and the patient plus family agree with this plan.   Second-degree heart block suspect is type II but could be type I Blood pressure is stable 150/73.  Reported some dizziness over the past 2 to 3 weeks. Avoid AV nodal agents. May consider contacting EP and having them evaluate the patient.  Suspect that patient is a poor candidate for a pacemaker.. Review of the rhythm with EP and other current cardiologist would concur that this is probably competing atrial pacemaker with underlying first-degree block and intermittent essentially Wenke Bach block.    Otherwise management per primary    Risk Assessment/Risk Scores:  TIMI Risk Score for Unstable Angina or Non-ST Elevation MI:   The patient's TIMI risk score is 3, which indicates a 13% risk of all cause mortality, new or recurrent myocardial infarction or need for urgent revascularization in the next 14 days.         For questions or updates, please contact [facility] Please consult [email or web address] for contact info under     Signed, Morse Clause, PA-C  08/20/2024 5:24 PM   ATTENDING ATTESTATION  I have seen, examined and evaluated the patient this afternoon along with Morse Clause, PA.  After reviewing all the available data and chart, we discussed the patients laboratory, study & physical findings as well as symptoms in detail.  I agree with his findings, examination as well as impression  recommendations as per our discussion.    Attending adjustments noted in italics.   Very pleasant elderly woman presenting with a non-STEMI.  Currently chest pain-free.  Plans are for early conservative  management with IV heparin , continue Plavix , reasonable to start moderate dose statin.  Based on blood pressure range, can consider additional medical options with potentially nitrates or beta-blocker.    Alm MICAEL Clay, MD, MS Alm Clay, M.D., M.S. Interventional Cardiologist  [facility] Pager # [phone]

## 2024-08-20 NOTE — ED Notes (Signed)
 Unable to collect urine rn aware at

## 2024-08-20 NOTE — ED Triage Notes (Signed)
 Pt c/o central cp, sob, and vomiting since yesterday; NAD at present; pt wears 2L o2 at baseline; denies pain currently; endorses non productive cough

## 2024-08-20 NOTE — ED Provider Triage Note (Signed)
 Emergency Medicine Provider Triage Evaluation Note  Catherine Vazquez , a 89 y.o. female  was evaluated in triage.  Pt complains of cp. Pain to left chest yesterday which has subsided.  Also report nausea and vomited 3 times yesterday as well, that has improved.  No fever, chills, cough, abd pain, back pain, dysuria or sob  Review of Systems  Positive: As above Negative: As above  Physical Exam  BP (!) 157/94 (BP Location: Right Arm)   Pulse 69   Temp 98.2 F (36.8 C)   Resp 16   SpO2 91%  Gen:   Awake, no distress   Resp:  Normal effort  MSK:   Moves extremities without difficulty  Other:  Wearing supplemental O2, in NAD  Medical Decision Making  Medically screening exam initiated at 12:06 PM.  Appropriate orders placed.  Catherine Vazquez was informed that the remainder of the evaluation will be completed by another provider, this initial triage assessment does not replace that evaluation, and the importance of remaining in the ED until their evaluation is complete.     Catherine Colon, PA-C 08/20/24 1208

## 2024-08-20 NOTE — ED Notes (Signed)
 Pt returned from CT

## 2024-08-20 NOTE — Progress Notes (Signed)
 ANTICOAGULATION CONSULT NOTE  Pharmacy Consult for Heparin  Indication: chest pain/ACS  No Known Allergies  Patient Measurements:   Heparin  Dosing Weight: 71.7 kg  Vital Signs: Temp: 97.7 F (36.5 C) (11/25 1514) Temp Source: Oral (11/25 1514) BP: 148/51 (11/25 1514) Pulse Rate: 57 (11/25 1514)  Labs: Recent Labs    08/20/24 1138 08/20/24 1327  HGB 13.8  --   HCT 42.1  --   PLT 261  --   CREATININE 0.86  --   TROPONINIHS 4,672* 6,481*    CrCl cannot be calculated (Unknown ideal weight.).   Medical History: Past Medical History:  Diagnosis Date   Arthritis    CHF (congestive heart failure) (HCC)    Diabetes mellitus without complication (HCC)    Hypertension    Assessment: 36 yof with a history of DM, HTN, HF. Patient is presenting with chest pain. Heparin  per pharmacy consult placed for chest pain/ACS.  Patient is not on anticoagulation prior to arrival.  Hgb 13.8; plt 261 hsTrop 6481  Goal of Therapy:  Heparin  level 0.3-0.7 units/ml Monitor platelets by anticoagulation protocol: Yes   Plan:  No initial heparin  bolus given patient age Start heparin  infusion at 850 units/hr Check anti-Xa level in 8 hours and daily while on heparin  Continue to monitor H&H and platelets  Dorn Buttner, PharmD, BCPS 08/20/2024 3:20 PM ED Clinical Pharmacist -  [phone]

## 2024-08-21 ENCOUNTER — Inpatient Hospital Stay (HOSPITAL_COMMUNITY)

## 2024-08-21 DIAGNOSIS — I214 Non-ST elevation (NSTEMI) myocardial infarction: Secondary | ICD-10-CM | POA: Diagnosis not present

## 2024-08-21 DIAGNOSIS — Z794 Long term (current) use of insulin: Secondary | ICD-10-CM

## 2024-08-21 DIAGNOSIS — I5030 Unspecified diastolic (congestive) heart failure: Secondary | ICD-10-CM | POA: Diagnosis not present

## 2024-08-21 DIAGNOSIS — E782 Mixed hyperlipidemia: Secondary | ICD-10-CM | POA: Diagnosis not present

## 2024-08-21 DIAGNOSIS — E1159 Type 2 diabetes mellitus with other circulatory complications: Secondary | ICD-10-CM | POA: Diagnosis not present

## 2024-08-21 LAB — HEPARIN LEVEL (UNFRACTIONATED)
Heparin Unfractionated: 0.37 [IU]/mL (ref 0.30–0.70)
Heparin Unfractionated: <0.1 [IU]/mL — ABNORMAL LOW (ref 0.30–0.70)

## 2024-08-21 LAB — GLUCOSE, CAPILLARY
Glucose-Capillary: 134 mg/dL — ABNORMAL HIGH (ref 70–99)
Glucose-Capillary: 183 mg/dL — ABNORMAL HIGH (ref 70–99)
Glucose-Capillary: 158 mg/dL — ABNORMAL HIGH (ref 70–99)
Glucose-Capillary: 186 mg/dL — ABNORMAL HIGH (ref 70–99)

## 2024-08-21 LAB — TSH: TSH: 4.445 u[IU]/mL (ref 0.350–4.500)

## 2024-08-21 LAB — BASIC METABOLIC PANEL WITH GFR
Anion gap: 10 (ref 5–15)
BUN: 14 mg/dL (ref 8–23)
CO2: 26 mmol/L (ref 22–32)
Calcium: 8.8 mg/dL — ABNORMAL LOW (ref 8.9–10.3)
Chloride: 102 mmol/L (ref 98–111)
Creatinine, Ser: 0.87 mg/dL (ref 0.44–1.00)
GFR, Estimated: >60 mL/min (ref >60)
Glucose, Bld: 154 mg/dL — ABNORMAL HIGH (ref 70–99)
Potassium: 4 mmol/L (ref 3.5–5.1)
Sodium: 138 mmol/L (ref 135–145)

## 2024-08-21 LAB — ECHOCARDIOGRAM COMPLETE
Area-P 1/2: 3.6 cm2
Height: 65 in
S' Lateral: 2.2 cm
Weight: 2518.54 [oz_av]

## 2024-08-21 LAB — CBC
HCT: 40 % (ref 36.0–46.0)
Hemoglobin: 13.4 g/dL (ref 12.0–15.0)
MCH: 31.5 pg (ref 26.0–34.0)
MCHC: 33.5 g/dL (ref 30.0–36.0)
MCV: 93.9 fL (ref 80.0–100.0)
Platelets: 254 K/uL (ref 150–400)
RBC: 4.26 MIL/uL (ref 3.87–5.11)
RDW: 13.3 % (ref 11.5–15.5)
WBC: 6.4 K/uL (ref 4.0–10.5)
nRBC: 0 % (ref 0.0–0.2)

## 2024-08-21 LAB — HEMOGLOBIN A1C
Hgb A1c MFr Bld: 6.7 % — ABNORMAL HIGH (ref 4.8–5.6)
Mean Plasma Glucose: 146 mg/dL

## 2024-08-21 LAB — LIPID PANEL
Cholesterol: 232 mg/dL — ABNORMAL HIGH (ref 0–200)
HDL: 60 mg/dL (ref >40)
LDL Cholesterol: 150 mg/dL — ABNORMAL HIGH (ref 0–99)
Total CHOL/HDL Ratio: 3.9 ratio
Triglycerides: 111 mg/dL (ref <150)
VLDL: 22 mg/dL (ref 0–40)

## 2024-08-21 MED ORDER — HEPARIN (PORCINE) 25000 UT/250ML-% IV SOLN
1100.0000 [IU]/h | INTRAVENOUS | Status: AC
  Administered 2024-08-22: 850 [IU]/h via INTRAVENOUS
  Filled 2024-08-21: qty 250

## 2024-08-21 MED ORDER — CLOPIDOGREL BISULFATE 75 MG PO TABS
75.0000 mg | ORAL_TABLET | Freq: Every day | ORAL | Status: DC
  Administered 2024-08-22 – 2024-08-23 (×2): 75 mg via ORAL
  Filled 2024-08-21 (×2): qty 1

## 2024-08-21 MED ORDER — CLOPIDOGREL BISULFATE 75 MG PO TABS
300.0000 mg | ORAL_TABLET | Freq: Once | ORAL | Status: AC
  Administered 2024-08-21: 300 mg via ORAL
  Filled 2024-08-21: qty 4

## 2024-08-21 MED ORDER — ORAL CARE MOUTH RINSE: 15.0000 mL | OROMUCOSAL | Status: DC | PRN

## 2024-08-21 NOTE — Progress Notes (Signed)
   08/21/24 1110  Spiritual Encounters  Type of Visit Initial  Care provided to: Patient  Reason for visit Routine spiritual support  OnCall Visit No   Chaplain provided spiritual care and emotional support to Patient. Chaplain and patient talked about many things including faith, life, sickness, and her granddaughter. Patient is a person of great faith [facility]) and appears to be in good spirits and is at peace. Chaplain remains available upon request.  Chaplain Yeriel Mineo

## 2024-08-21 NOTE — Progress Notes (Addendum)
 Progress Note  Patient Name: Catherine Vazquez Date of Encounter: 08/21/2024 [facility] Cardiologist: Alm Clay, MD   Interval Summary    No further episodes of chest pain.  Sitting up comfortably in bed.  Vital Signs Vitals:   08/20/24 2056 08/20/24 2303 08/21/24 0456 08/21/24 0751  BP: (!) 122/46 (!) 117/48 103/73 (!) 131/51  Pulse: (!) 55 (!) 58 70 62  Resp: 16 16 20 17   Temp: 98.3 F (36.8 C) 98.2 F (36.8 C) 98 F (36.7 C) 97.7 F (36.5 C)  TempSrc: Oral Oral Oral Oral  SpO2: 99% 98% 94%   Weight: 71.4 kg     Height:        Intake/Output Summary (Last 24 hours) at 08/21/2024 0951 Last data filed at 08/21/2024 0414 Gross per 24 hour  Intake 86.07 ml  Output --  Net 86.07 ml      08/20/2024    8:56 PM 08/20/2024    3:50 PM 11/17/2021   10:32 AM  Last 3 Weights  Weight (lbs) 157 lb 6.5 oz 160 lb 4.4 oz 167 lb  Weight (kg) 71.4 kg 72.7 kg 75.751 kg     Cardiac Studies Echocardiogram: EF 60 to 65%.  Anterolateral hypokinesis.  Mild LVH.  GR 2 DD.  Elevated LAP.  Severely evaded PAP.  Moderate MAC.  AoV sclerosis without stenosis.   Telemetry/ECG  Second-degree A-V block Mobitz type I with intermittent episodes of 2-1 AV block as well as multifocal PVCs- Personally Reviewed  Physical Exam  GEN: No acute distress.   Neck: No JVD Cardiac: Irreg Irreg, no murmurs, rubs, or gallops.  Respiratory: Clear to auscultation bilaterally. GI: Soft, nontender, non-distended  MS: No edema  Assessment & Plan   89 y.o. female with a hx of type 2 diabetes, hypertension, prior CVA in 2018 and arthritis who was seen 08/20/2024 for the evaluation of NSTEMI at the request of Lynwood Moulder MD.   NSTEMI -- Presented with episodes of chest pain over the past week -- High-sensitivity troponin peaked at 7370, EKG on admission showed second-degree AV block Mobitz type I with ventricular escape rhythm vs 2-1 AV block and T wave inversion in lateral leads. --  Continue IV heparin  for total of 48 hours, started on aspirin  81 mg yesterday.  Added Plavix  with 300 mg load today per discussion with MD -- Continue atorvastatin  40 mg daily -- Echocardiogram shows preserved EF with anterolateral hypokinesis suggesting probably either high OM/diagonal or ramus or medius disease.  Conduction disease -- EKG on admission possible second-degree AV block Mobitz type I with ventricular escape rhythm versus 2-1 AV block.  EKG was reviewed with EP felt that this was consistent with competing atrial pacemaker with underlying first-degree block and intermittent wenkebach.  -- avoid AVN agents - Hopefully, we will not need to consider PPM.  I have reviewed the EKGs with EP as noted above.  Hyperlipidemia -- LDL 150, HDL 60 -- Started on atorvastatin  40 mg daily on admission  For questions or updates, please contact [facility] Please consult [email or web address] for contact info under        Signed, Manuelita Rummer, NP    ATTENDING ATTESTATION  I have seen, examined and evaluated the patient this AM on rounds along with Manuelita Rummer, NP-C.  After reviewing all the available data and chart, we discussed the patients laboratory, study & physical findings as well as symptoms in detail.  I agree with her findings, examination as well as impression  recommendations as per our discussion.    Attending adjustments noted in italics.   Stable Catherine Hila, NP admitted with non-STEMI that were treated medically with IV heparin , aspirin  Plavix , statin.  No beta-blocker because of conduction disease.  Plan 48 hours IV heparin  along with aspirin  Plavix  statin. Reassess tomorrow.  48 hours of heparin  should be complete by tomorrow afternoon.    Alm MICAEL Clay, MD, MS Alm Clay, M.D., M.S. Interventional Cardiologist  [facility] Pager # [phone]

## 2024-08-21 NOTE — Evaluation (Signed)
 Occupational Therapy Evaluation Patient Details Name: Catherine Vazquez MRN: [id number] DOB: [birth year] Today's Date: 08/21/2024   History of Present Illness   Pt is a 89 y.o. F presenting to [facility] on 08/20/24 w/ L sided chest pain lasting 10-15 mins x2 bouts, and SOB. Pt was found to have elevated troponins and was started on heparin . Pt admitted for further work-up. PMH is significant for CVA, HTN, HLD, DM-2, and chronic resp. failure on 2L..     Clinical Impressions Patient reports that she lives at a ILF facility and is Independent in ADLs and family provided transport for MD appts and gets her groceries for her. She uses a rollator for functional mobility at a mod I level.  Patient currently requires minA for sit to stand for functional transfers and minA for LB Adls at this time  She would benefit from additional OT intervention to address functional deficits listed above in order for patient to return to PLOF and would benefit HHOT to address and remaining deficits in order for patient to return to PLOF     If plan is discharge home, recommend the following:   A little help with walking and/or transfers;A little help with bathing/dressing/bathroom;Assistance with cooking/housework;Assist for transportation     Functional Status Assessment   Patient has had a recent decline in their functional status and demonstrates the ability to make significant improvements in function in a reasonable and predictable amount of time.     Equipment Recommendations   None recommended by OT     Recommendations for Other Services         Precautions/Restrictions   Precautions Precautions: Fall Recall of Precautions/Restrictions: Intact Restrictions Weight Bearing Restrictions Per Provider Order: No     Mobility Bed Mobility Overal bed mobility: Needs Assistance Bed Mobility: Supine to Sit, Sit to Supine     Supine to sit: Contact guard, HOB elevated, Used rails Sit to  supine: Contact guard assist, Used rails        Transfers Overall transfer level: Needs assistance Equipment used: Rolling walker (2 wheels) Transfers: Sit to/from Stand Sit to Stand: Min assist           General transfer comment:  (patient requires verbal cues for patient to push from bed using UEs and mins A to power up to standing position)      Balance Overall balance assessment: Needs assistance Sitting-balance support: No upper extremity supported, Feet supported Sitting balance-Leahy Scale: Good     Standing balance support: Bilateral upper extremity supported, During functional activity, Reliant on assistive device for balance                               ADL either performed or assessed with clinical judgement   ADL Overall ADL's : Needs assistance/impaired Eating/Feeding: Set up;Bed level   Grooming: Wash/dry hands;Wash/dry face;Sitting;Set up   Upper Body Bathing: Set up;Sitting   Lower Body Bathing: Minimal assistance   Upper Body Dressing : Set up;Sitting   Lower Body Dressing: Minimal assistance   Toilet Transfer: Minimal assistance;Rolling walker (2 wheels);BSC/3in1 (to power up from [facility])   Toileting- Clothing Manipulation and Hygiene: Minimal assistance       Functional mobility during ADLs: Contact guard assist;Rolling walker (2 wheels)       Vision Baseline Vision/History: 1 Wears glasses Patient Visual Report: No change from baseline Vision Assessment?: No apparent visual deficits     Perception  Praxis         Pertinent Vitals/Pain Pain Assessment Pain Assessment: No/denies pain     Extremity/Trunk Assessment Upper Extremity Assessment Upper Extremity Assessment: Generalized weakness   Lower Extremity Assessment Lower Extremity Assessment: Defer to PT evaluation   Cervical / Trunk Assessment Cervical / Trunk Assessment: Kyphotic   Communication Communication Communication: Impaired Factors  Affecting Communication: Hearing impaired   Cognition Arousal: Alert Behavior During Therapy: WFL for tasks assessed/performed Cognition: No apparent impairments                               Following commands: Impaired Following commands impaired: Follows multi-step commands with increased time     Cueing  General Comments   Cueing Techniques: Verbal cues;Visual cues;Gestural cues  Initial pulse ox reading on RA, 95%. Unable to obtain Sp02 w/ portable or room pulse ox throughout gait, but pt reports mild SOB that improves upon sitting EOB.   Exercises     Shoulder Instructions      Home Living Family/patient expects to be discharged to:: Assisted living                             Home Equipment: Rollator (4 wheels);Shower seat;Toilet riser;Grab bars - tub/shower;Cane - single point;Lift chair          Prior Functioning/Environment Prior Level of Function : Independent/Modified Independent             Mobility Comments: pt is mod I utilizing rollator for all mobility ADLs Comments: independent    OT Problem List: Decreased strength;Decreased safety awareness;Decreased activity tolerance   OT Treatment/Interventions: Self-care/ADL training;Therapeutic exercise;Therapeutic activities;Patient/family education;Energy conservation      OT Goals(Current goals can be found in the care plan section)   Acute Rehab OT Goals OT Goal Formulation: With patient Time For Goal Achievement: 09/04/24 Potential to Achieve Goals: Good   OT Frequency:  Min 2X/week    Co-evaluation              AM-PAC OT 6 Clicks Daily Activity     Outcome Measure Help from another person eating meals?: A Little Help from another person taking care of personal grooming?: A Little Help from another person toileting, which includes using toliet, bedpan, or urinal?: A Little Help from another person bathing (including washing, rinsing, drying)?: A  Little Help from another person to put on and taking off regular upper body clothing?: A Little Help from another person to put on and taking off regular lower body clothing?: A Lot 6 Click Score: 17   End of Session Equipment Utilized During Treatment: Rolling walker (2 wheels) Nurse Communication: Mobility status  Activity Tolerance: Patient tolerated treatment well Patient left: in bed;with call bell/phone within reach  OT Visit Diagnosis: Unsteadiness on feet (R26.81);Muscle weakness (generalized) (M62.81)                Time: 8689-8672 OT Time Calculation (min): 17 min Charges:  OT General Charges $OT Visit: 1 Visit OT Evaluation $OT Eval Moderate Complexity: 1 Mod  Lamarr Pouch OT/L  Lamarr JONETTA Pouch 08/21/2024, 2:47 PM

## 2024-08-21 NOTE — Plan of Care (Signed)
  Problem: Education: Goal: Ability to describe self-care measures that may prevent or decrease complications (Diabetes Survival Skills Education) will improve Outcome: Progressing Goal: Individualized Educational Video(s) Outcome: Progressing   Problem: Coping: Goal: Ability to adjust to condition or change in health will improve Outcome: Progressing   Problem: Fluid Volume: Goal: Ability to maintain a balanced intake and output will improve Outcome: Progressing   Problem: Health Behavior/Discharge Planning: Goal: Ability to identify and utilize available resources and services will improve Outcome: Progressing Goal: Ability to manage health-related needs will improve Outcome: Progressing   Problem: Education: Goal: Knowledge of General Education information will improve Description: Including pain rating scale, medication(s)/side effects and non-pharmacologic comfort measures Outcome: Progressing   Problem: Health Behavior/Discharge Planning: Goal: Ability to manage health-related needs will improve Outcome: Progressing   Problem: Clinical Measurements: Goal: Ability to maintain clinical measurements within normal limits will improve Outcome: Progressing Goal: Will remain free from infection Outcome: Progressing Goal: Diagnostic test results will improve Outcome: Progressing Goal: Respiratory complications will improve Outcome: Progressing Goal: Cardiovascular complication will be avoided Outcome: Progressing   Problem: Activity: Goal: Risk for activity intolerance will decrease Outcome: Progressing   Problem: Pain Managment: Goal: General experience of comfort will improve and/or be controlled Outcome: Progressing

## 2024-08-21 NOTE — Progress Notes (Signed)
 Echocardiogram 2D Echocardiogram has been performed.  Catherine Vazquez 08/21/2024, 8:34 AM

## 2024-08-21 NOTE — Plan of Care (Signed)
   Problem: Metabolic: Goal: Ability to maintain appropriate glucose levels will improve Outcome: Progressing

## 2024-08-21 NOTE — Evaluation (Signed)
 Physical Therapy Evaluation Patient Details Name: Catherine Vazquez MRN: [id number] DOB: September 05, 1925 Today's Date: 08/21/2024  History of Present Illness  Pt is a 89 y.o. F presenting to [facility] on 08/20/24 w/ L sided chest pain lasting 10-15 mins x2 bouts, and SOB. Pt was found to have elevated troponins and was started on heparin . Pt admitted for further work-up. PMH is significant for CVA, HTN, HLD, DM-2, and chronic resp. failure on 2L..   Clinical Impression  Prior to admittance pt was using a rollator for mobility and was independent with ADLs. Pt presents to evaluation with deficits in mobility, strength, power, and activity tolerance. Pt was able to ambulate short room level distances with AD and no physical assistance given. Pt requires verbal cueing for sequencing throughout all mobility. Discussed possibility of follow-up therapies after discharge with pt declining. Pt feels she is mobilizing near her baseline, and her granddaughter and grandson report they will be staying with her after discharge and can provide any assistance needed. PT will continue to treat pt while she is admitted. No follow-up therapies recommending at this time.       If plan is discharge home, recommend the following: A little help with walking and/or transfers;A little help with bathing/dressing/bathroom;Assistance with cooking/housework;Assist for transportation   Can travel by private vehicle        Equipment Recommendations None recommended by PT (pt prefers using her rollator)  Recommendations for Other Services       Functional Status Assessment Patient has had a recent decline in their functional status and demonstrates the ability to make significant improvements in function in a reasonable and predictable amount of time.     Precautions / Restrictions Precautions Precautions: Fall Recall of Precautions/Restrictions: Intact Restrictions Weight Bearing Restrictions Per Provider Order: No       Mobility  Bed Mobility Overal bed mobility: Needs Assistance Bed Mobility: Supine to Sit, Sit to Supine     Supine to sit: Contact guard, HOB elevated, Used rails Sit to supine: Contact guard assist, Used rails   General bed mobility comments: Pt completed sup to sit w/ HOB elevated and used railing w/ no physical assistance given; increased time to complete. Pt able to scoot towards hed of bed mod I using bed railing. Pt then completed sit to sup w/ HOB flat and increased time to complete.    Transfers Overall transfer level: Needs assistance Equipment used: Rolling walker (2 wheels) Transfers: Sit to/from Stand Sit to Stand: Min assist           General transfer comment: Pt completed sit to stand from edge of bed w/ RW and minimal physical assistance for initial power-up. VC given to scoot hips towards edge of bed, increase forward trunk lean for glute clearance, and to push up from bed w/ RUE.    Ambulation/Gait Ambulation/Gait assistance: Contact guard assist Gait Distance (Feet): 16 Feet Assistive device: Rolling walker (2 wheels) Gait Pattern/deviations: Step-through pattern, Decreased stride length, Knee flexed in stance - right, Knee flexed in stance - left, Narrow base of support, Trunk flexed Gait velocity: reduced Gait velocity interpretation: <1.31 ft/sec, indicative of household ambulator   General Gait Details: Pt ambulates w/ increased reliance on RW for stability, and demonstrates reduced cadence (espcially throuhgout turns), and narrow BOS throughout.  Stairs            Wheelchair Mobility     Tilt Bed    Modified Rankin (Stroke Patients Only)  Balance Overall balance assessment: Needs assistance Sitting-balance support: No upper extremity supported, Feet supported Sitting balance-Leahy Scale: Good Sitting balance - Comments: able to sit EOB and shift weight for seated marches w/ no losses of balance   Standing balance support:  Bilateral upper extremity supported, During functional activity, Reliant on assistive device for balance Standing balance-Leahy Scale: Poor Standing balance comment: reliant on external support. displays bilateral slight knee flexion in stance.                             Pertinent Vitals/Pain Pain Assessment Pain Assessment: No/denies pain    Home Living Family/patient expects to be discharged to:: Assisted living                 Home Equipment: Rollator (4 wheels);Shower seat;Toilet riser;Grab bars - tub/shower;Cane - single point;Lift chair      Prior Function Prior Level of Function : Independent/Modified Independent             Mobility Comments: pt is mod I utilizing rollator for all mobility ADLs Comments: independent     Extremity/Trunk Assessment   Upper Extremity Assessment Upper Extremity Assessment: Generalized weakness    Lower Extremity Assessment Lower Extremity Assessment: Generalized weakness    Cervical / Trunk Assessment Cervical / Trunk Assessment: Kyphotic  Communication   Communication Communication: Impaired Factors Affecting Communication: Hearing impaired    Cognition Arousal: Alert Behavior During Therapy: WFL for tasks assessed/performed   PT - Cognitive impairments: No apparent impairments                         Following commands: Impaired Following commands impaired: Follows multi-step commands with increased time     Cueing Cueing Techniques: Verbal cues, Visual cues, Gestural cues     General Comments General comments (skin integrity, edema, etc.): Initial pulse ox reading on RA, 95%. Unable to obtain Sp02 w/ portable or room pulse ox throughout gait, but pt reports mild SOB that improves upon sitting EOB.    Exercises     Assessment/Plan    PT Assessment Patient needs continued PT services  PT Problem List Decreased strength;Decreased activity tolerance;Decreased balance;Decreased  mobility;Decreased knowledge of use of DME       PT Treatment Interventions DME instruction;Gait training;Functional mobility training;Therapeutic activities;Therapeutic exercise;Balance training;Patient/family education;Wheelchair mobility training    PT Goals (Current goals can be found in the Care Plan section)  Acute Rehab PT Goals Patient Stated Goal: to go home PT Goal Formulation: With patient/family Time For Goal Achievement: 09/04/24 Potential to Achieve Goals: Fair    Frequency Min 2X/week     Co-evaluation               AM-PAC PT 6 Clicks Mobility  Outcome Measure Help needed turning from your back to your side while in a flat bed without using bedrails?: A Little Help needed moving from lying on your back to sitting on the side of a flat bed without using bedrails?: A Little Help needed moving to and from a bed to a chair (including a wheelchair)?: A Little Help needed standing up from a chair using your arms (e.g., wheelchair or bedside chair)?: A Little Help needed to walk in hospital room?: A Little Help needed climbing 3-5 steps with a railing? : Total 6 Click Score: 16    End of Session Equipment Utilized During Treatment: Gait belt Activity Tolerance: Patient tolerated treatment well  Patient left: in bed;with call bell/phone within reach;with family/visitor present Nurse Communication: Mobility status PT Visit Diagnosis: Muscle weakness (generalized) (M62.81);Other abnormalities of gait and mobility (R26.89)    Time: 8879-8855 PT Time Calculation (min) (ACUTE ONLY): 24 min   Charges:   PT Evaluation $PT Eval Low Complexity: 1 Low   PT General Charges $$ ACUTE PT VISIT: 1 Visit         Leontine Hilt DPT Acute Rehab Services [phone] Prefer contact via chat   Leontine B Chaston Bradburn 08/21/2024, 1:00 PM

## 2024-08-21 NOTE — Progress Notes (Signed)
 ANTICOAGULATION CONSULT NOTE  Pharmacy Consult for Heparin  Indication: chest pain/ACS  No Known Allergies  Patient Measurements: Height: 5' 5 (165.1 cm) Weight: 71.4 kg (157 lb 6.5 oz) IBW/kg (Calculated) : 57 Heparin  Dosing Weight: 71.7 kg  Vital Signs: Temp: 97.7 F (36.5 C) (11/26 0751) Temp Source: Oral (11/26 0751) BP: 131/51 (11/26 0751) Pulse Rate: 62 (11/26 0751)  Labs: Recent Labs    08/20/24 1138 08/20/24 1327 08/20/24 1548 08/20/24 2350 08/21/24 1023  HGB 13.8  --   --  13.4  --   HCT 42.1  --   --  40.0  --   PLT 261  --   --  254  --   HEPARINUNFRC  --   --   --  <0.10* 0.37  CREATININE 0.86  --   --  0.87  --   TROPONINIHS 4,672* 6,481* 7,370*  --   --     Estimated Creatinine Clearance: 36.6 mL/min (by C-G formula based on SCr of 0.87 mg/dL).   Medical History: Past Medical History:  Diagnosis Date   Arthritis    CHF (congestive heart failure) (HCC)    Diabetes mellitus without complication (HCC)    Hypertension    Assessment: 44 yof with a history of DM, HTN, HF. Patient is presenting with chest pain. Heparin  per pharmacy consult placed for chest pain/ACS. Patient is not on anticoagulation prior to arrival.   -heparin  level is at goal on 850 units/hr -plans for 48 hrs heparin   Goal of Therapy:  Heparin  level 0.3-0.7 units/ml Monitor platelets by anticoagulation protocol: Yes   Plan:  -Continue heparin  at 850 units/hr -Daily heparin  level and CBC -Heparin  stop time 11/27 at 4:30pm  Prentice Poisson, PharmD Clinical Pharmacist **Pharmacist phone directory can now be found on [email or web address] ([email or web address]).  Listed under [facility].

## 2024-08-21 NOTE — Progress Notes (Signed)
 PROGRESS NOTE    Catherine Vazquez  FMW:[id number] DOB: [birth year] DOA: 08/20/2024 PCP: Ilah Crigler, MD  Chief Complaint  Patient presents with   Chest Pain    Brief Narrative:   Catherine Vazquez is Lizzie An 89 y.o. female with medical history significant of prior CVA, HTN, HLD, DM-2, chronic hypoxic respiratory failure on 2 L of oxygen at baseline who presented to the ED with left-sided chest pain.   She's been admitted with an NSTEMI.  Planning medical therapy.    Assessment & Plan:   Principal Problem:   NSTEMI, initial episode of care [facility]) Active Problems:   HTN (hypertension)   CHF with left ventricular diastolic dysfunction, NYHA class 2 (HCC)   HLD (hyperlipidemia)   DM (diabetes mellitus), type 2 (HCC)  Goals of Care Sounds like patient and family were not in favor of any procedures, though apparently on admission [name] said she wanted everything done (when considering code status).  Discussed with granddaughter today, some concern regarding grandmothers ability to completely understand discussion.  I think DNR would be reasonable as this would be consistent with the conservative and appropriate approach taken at admission to avoid procedures including cath.  Granddaughter Alto) and Grandson (Manny (sp?)) it sounds like are the next of kin, will ask palliative to help with additional GOC conversations.  NSTEMI CP free at this time Troponin elevated to 7370 Seen by cardiology, planning 48 hrs heparin , aspirin , plavix , statin Echo with EF 60-65%, regional wall motion abnormalities (hypokinesis of anterolateral wall, but poor visualization)  Conduction Disease Appreciate cardiology assistance Holding AV nodal blockers  Dyslipidemia Statin  3 mm LUL Pulmonary Nodule Outpatient follow up      DVT prophylaxis: heparin  gtt Code Status: full Family Communication: granddaughter at bedside Disposition:   Status is: Inpatient Remains inpatient appropriate  because: need for continued cardiology care   Consultants:  cards  Procedures:  Echo IMPRESSIONS     1. Left ventricular ejection fraction, by estimation, is 60 to 65%. The  left ventricle has normal function. The left ventricle demonstrates  regional wall motion abnormalities. Appears hypokinesis of anterolateral  wall but poor visualization; overall LV  systolic function appears preserved. There is moderate left ventricular  hypertrophy. Left ventricular diastolic parameters are consistent with  Grade II diastolic dysfunction (pseudonormalization). Elevated left atrial  pressure.   2. Right ventricular systolic function is normal. The right ventricular  size is normal. There is severely elevated pulmonary artery systolic  pressure.   3. The mitral valve is degenerative. Trivial mitral valve regurgitation.  No evidence of mitral stenosis. Moderate mitral annular calcification.   4. The aortic valve is tricuspid. Aortic valve regurgitation is not  visualized. Aortic valve sclerosis is present, with no evidence of aortic  valve stenosis.   Antimicrobials:  Anti-infectives (From admission, onward)    None       Subjective: Granddaughter at bedside No CP today  Objective: Vitals:   08/21/24 0456 08/21/24 0751 08/21/24 1259 08/21/24 1627  BP: 103/73 (!) 131/51 (!) 164/70 134/76  Pulse: 70 62 66 72  Resp: 20 17 16 17   Temp: 98 F (36.7 C) 97.7 F (36.5 C) 98 F (36.7 C) 98.3 F (36.8 C)  TempSrc: Oral Oral Oral Oral  SpO2: 94%     Weight:      Height:        Intake/Output Summary (Last 24 hours) at 08/21/2024 1707 Last data filed at 08/21/2024 1628 Gross per 24 hour  Intake  513.96 ml  Output --  Net 513.96 ml   Filed Weights   08/20/24 1550 08/20/24 2056  Weight: 72.7 kg 71.4 kg    Examination:  General exam: Appears calm and comfortable  Respiratory system: Clear to auscultation. Respiratory effort normal. Cardiovascular system:  RRR Gastrointestinal system: Abdomen is nondistended, soft and nontender.  Central nervous system: pleasant, but confused - moving all extremities Extremities: no LEE    Data Reviewed: I have personally reviewed following labs and imaging studies  CBC: Recent Labs  Lab 08/20/24 1138 08/20/24 2350  WBC 5.1 6.4  HGB 13.8 13.4  HCT 42.1 40.0  MCV 95.5 93.9  PLT 261 254    Basic Metabolic Panel: Recent Labs  Lab 08/20/24 1138 08/20/24 2350  NA 139 138  K 4.1 4.0  CL 103 102  CO2 25 26  GLUCOSE 136* 154*  BUN 14 14  CREATININE 0.86 0.87  CALCIUM  9.0 8.8*    GFR: Estimated Creatinine Clearance: 36.6 mL/min (by C-G formula based on SCr of 0.87 mg/dL).  Liver Function Tests: Recent Labs  Lab 08/20/24 1138  AST 71*  ALT 16  ALKPHOS 67  BILITOT 0.8  PROT 6.5  ALBUMIN 3.4*    CBG: Recent Labs  Lab 08/20/24 2300 08/21/24 0749 08/21/24 1256 08/21/24 1626  GLUCAP 164* 134* 183* 158*     No results found for this or any previous visit (from the past 240 hours).       Radiology Studies: ECHOCARDIOGRAM COMPLETE Result Date: 08/21/2024    ECHOCARDIOGRAM REPORT   Patient Name:   Catherine Vazquez [facility] Date of Exam: 08/21/2024 Medical Rec #:  [id number]         Height:       65.0 in Accession #:    [id number]        Weight:       157.4 lb Date of Birth:  05-26-1925        BSA:          1.787 m Patient Age:    89 years          BP:           131/51 mmHg Patient Gender: F                 HR:           62 bpm. Exam Location:  Inpatient Procedure: 2D Echo, Cardiac Doppler and Color Doppler (Both Spectral and Color            Flow Doppler were utilized during procedure). Indications:    NSTEMI  History:        Patient has prior history of Echocardiogram examinations, most                 recent 01/02/2020. CHF, Acute MI, Signs/Symptoms:Dyspnea; Risk                 Factors:Hypertension, Diabetes and Dyslipidemia.  Sonographer:    Juliene Rucks Referring Phys: [phone] [facility]  [facility]  Sonographer Comments: No subcostal window. IMPRESSIONS  1. Left ventricular ejection fraction, by estimation, is 60 to 65%. The left ventricle has normal function. The left ventricle demonstrates regional wall motion abnormalities. Appears hypokinesis of anterolateral wall but poor visualization; overall LV systolic function appears preserved. There is moderate left ventricular hypertrophy. Left ventricular diastolic parameters are consistent with Grade II diastolic dysfunction (pseudonormalization). Elevated left atrial pressure.  2. Right ventricular systolic function is normal. The right ventricular size  is normal. There is severely elevated pulmonary artery systolic pressure.  3. The mitral valve is degenerative. Trivial mitral valve regurgitation. No evidence of mitral stenosis. Moderate mitral annular calcification.  4. The aortic valve is tricuspid. Aortic valve regurgitation is not visualized. Aortic valve sclerosis is present, with no evidence of aortic valve stenosis. FINDINGS  Left Ventricle: Left ventricular ejection fraction, by estimation, is 60 to 65%. The left ventricle has normal function. The left ventricle demonstrates regional wall motion abnormalities. The left ventricular internal cavity size was small. There is moderate left ventricular hypertrophy. Left ventricular diastolic parameters are consistent with Grade II diastolic dysfunction (pseudonormalization). Elevated left atrial pressure. Right Ventricle: The right ventricular size is normal. No increase in right ventricular wall thickness. Right ventricular systolic function is normal. There is severely elevated pulmonary artery systolic pressure. The tricuspid regurgitant velocity is 3.74 m/s, and with an assumed right atrial pressure of 8 mmHg, the estimated right ventricular systolic pressure is 64.0 mmHg. Left Atrium: Left atrial size was normal in size. Right Atrium: Right atrial size was normal in size. Pericardium: There is  no evidence of pericardial effusion. Mitral Valve: The mitral valve is degenerative in appearance. Moderate mitral annular calcification. Trivial mitral valve regurgitation. No evidence of mitral valve stenosis. Tricuspid Valve: The tricuspid valve is normal in structure. Tricuspid valve regurgitation is mild. Aortic Valve: The aortic valve is tricuspid. Aortic valve regurgitation is not visualized. Aortic valve sclerosis is present, with no evidence of aortic valve stenosis. Pulmonic Valve: The pulmonic valve was not well visualized. Pulmonic valve regurgitation is trivial. Aorta: The aortic root and ascending aorta are structurally normal, with no evidence of dilitation. IAS/Shunts: The interatrial septum was not well visualized.  LEFT VENTRICLE PLAX 2D LVIDd:         3.50 cm   Diastology LVIDs:         2.20 cm   LV e' medial:    5.43 cm/s LV PW:         1.30 cm   LV E/e' medial:  16.9 LV IVS:        1.00 cm   LV e' lateral:   7.62 cm/s LVOT diam:     2.00 cm   LV E/e' lateral: 12.0 LV SV:         54 LV SV Index:   30 LVOT Area:     3.14 cm  RIGHT VENTRICLE RV Basal diam:  3.50 cm RV Mid diam:    3.00 cm LEFT ATRIUM             Index        RIGHT ATRIUM           Index LA Vol (A2C):   53.1 ml 29.72 ml/m  RA Area:     12.50 cm LA Vol (A4C):   26.5 ml 14.83 ml/m  RA Volume:   33.70 ml  18.86 ml/m LA Biplane Vol: 40.2 ml 22.50 ml/m  AORTIC VALVE LVOT Vmax:   78.60 cm/s LVOT Vmean:  51.900 cm/s LVOT VTI:    0.173 m  AORTA Ao Root diam: 3.30 cm Ao Asc diam:  3.00 cm MITRAL VALVE               TRICUSPID VALVE MV Area (PHT): 3.60 cm    TR Peak grad:   56.0 mmHg MV Decel Time: 211 msec    TR Vmax:        374.00 cm/s MV E velocity: 91.80 cm/s MV  Reggie Bise velocity: 98.50 cm/s  SHUNTS MV E/Delois Silvester ratio:  0.93        Systemic VTI:  0.17 m                            Systemic Diam: 2.00 cm Lonni Nanas MD Electronically signed by Lonni Nanas MD Signature Date/Time: 08/21/2024/10:12:58 AM    Final    CT CHEST WO  CONTRAST Result Date: 08/20/2024 EXAM: CT CHEST WITHOUT CONTRAST 08/20/2024 05:37:00 PM TECHNIQUE: CT of the chest was performed without the administration of intravenous contrast. Multiplanar reformatted images are provided for review. Automated exposure control, iterative reconstruction, and/or weight based adjustment of the mA/kV was utilized to reduce the radiation dose to as low as reasonably achievable. COMPARISON: None available. CLINICAL HISTORY: Dyspnea, chronic, unclear etiology. FINDINGS: MEDIASTINUM: Heart: Extensive multivessel coronary artery calcification. Global cardiac size within normal limits. No pericardial effusion. Central pulmonary arteries are of normal caliber. Mild atherosclerotic calcification within the thoracic aorta. No aortic aneurysm. The central airways are clear. No symptoms of obstructing lesion. LYMPH NODES: No mediastinal, hilar or axillary lymphadenopathy. LUNGS AND PLEURA: Small bibasilar cylindrical bronchiectasis and subpleural fibrotic change, likely post inflammatory in nature. No superimposed acute pulmonary infiltrate. No pulmonary edema. No pleural effusion or pneumothorax. SOFT TISSUES/BONES: Osseous structures are age appropriate with advanced degenerative changes within the thoracic spine and shoulders bilaterally. No acute bone abnormality. No lytic or blastic bone lesion. No acute abnormality of the soft tissues. UPPER ABDOMEN: Limited images of the upper abdomen demonstrates no acute abnormality. IMPRESSION: 1. No acute findings. 2. Mild bibasilar post-inflammatory change. No acute pulmonary infiltrate. 3. Extensive multivessel coronary artery calcification. Electronically signed by: Dorethia Molt MD 08/20/2024 05:58 PM EST RP Workstation: [id number]   CT Angio Chest PE W and/or Wo Contrast Result Date: 08/20/2024 CLINICAL DATA:  Pulmonary embolism (PE) suspected, high prob Central chest pain with shortness of breath and vomiting since yesterday. On home  oxygen. EXAM: CT ANGIOGRAPHY CHEST WITH CONTRAST TECHNIQUE: Multidetector CT imaging of the chest was performed using the standard protocol during bolus administration of intravenous contrast. Multiplanar CT image reconstructions and MIPs were obtained to evaluate the vascular anatomy. RADIATION DOSE REDUCTION: This exam was performed according to the departmental dose-optimization program which includes automated exposure control, adjustment of the mA and/or kV according to patient size and/or use of iterative reconstruction technique. CONTRAST:  75mL OMNIPAQUE  IOHEXOL  350 MG/ML SOLN COMPARISON:  Same date chest radiographs.  Chest CTA 11/13/2015. FINDINGS: Cardiovascular: The pulmonary arteries are well opacified with contrast to the level of the segmental branches. There is no evidence of acute pulmonary embolism. Mild aortic and coronary artery atherosclerosis without acute systemic abnormalities. The heart size is stable at the upper limits of normal. Small amount of fluid in the superior pericardial recess, stable. Mediastinum/Nodes: There are no enlarged mediastinal, hilar or axillary lymph nodes. The thyroid gland, trachea and esophagus demonstrate no significant findings. Lungs/Pleura: No pleural effusion or pneumothorax. Mildly progressive scarring and subpleural reticulation at both lung bases. No confluent airspace disease. New small nonspecific 3 mm left upper lobe nodule on image 47/7. No suspicious pulmonary nodules. Upper abdomen: Mild reflux of contrast into the IVC and hepatic veins. No acute findings are demonstrated within the visualized upper abdomen. Musculoskeletal/Chest wall: There is no chest wall mass or suspicious osseous finding. Multilevel spondylosis. Review of the MIP images confirms the above findings. IMPRESSION: 1. No evidence of acute pulmonary embolism or other  acute chest findings. 2. Mildly progressive scarring and subpleural reticulation at both lung bases. 3. New small  nonspecific 3 mm left upper lobe pulmonary nodule. Per Fleischner Society Guidelines, if patient is low risk for malignancy, no routine follow-up imaging is recommended. If patient is high risk for malignancy, Karan Ramnauth non-contrast Chest CT at 12 months is optional. If performed and the nodule is stable at 12 months, no further follow-up is recommended. These guidelines do not apply to immunocompromised patients and patients with cancer. Follow up in patients with significant comorbidities as clinically warranted. For lung cancer screening, adhere to Lung-RADS guidelines. Reference: Radiology. 2017; 284(1):228-43. 4.  Aortic Atherosclerosis (ICD10-I70.0). Electronically Signed   By: Elsie Perone M.D.   On: 08/20/2024 15:24   DG Chest 2 View Result Date: 08/20/2024 CLINICAL DATA:  cp/sob EXAM: CHEST - 2 VIEW COMPARISON:  04/07/2020, 11/13/2015 FINDINGS: Low lung volumes. Subtle, almost nodular airspace opacities in the right mid and right lower lung fields. No pleural effusion or pneumothorax. Mild cardiomegaly. No acute fracture or destructive lesions. Multilevel thoracic osteophytosis. Right humeral head rotator cuff anchor. IMPRESSION: Subtle, almost nodular airspace opacities in the right mid and right lower lung fields, which may be artifactual from overlapping bony and vascular structures. Alternatively, Braydn Carneiro developing bronchopneumonia or underlying pulmonary nodules could have this appearance in the correct clinical context. Nonemergent chest CT may be of benefit for further characterization. Electronically Signed   By: Rogelia Myers M.D.   On: 08/20/2024 12:31        Scheduled Meds:  aspirin  EC  81 mg Oral Daily   atorvastatin   40 mg Oral Daily   [START ON 08/22/2024] clopidogrel   75 mg Oral Daily   insulin  aspart  0-6 Units Subcutaneous TID WC   latanoprost   1 drop Both Eyes QHS   sodium chloride  flush  3 mL Intravenous Q12H   Continuous Infusions:  heparin  850 Units/hr (08/21/24 1500)      LOS: 1 day    Time spent: over 30 min     Meliton Monte, MD [facility]   To contact the attending provider between 7A-7P or the covering provider during after hours 7P-7A, please log into the web site [email or web address] and access using universal [place] password for that web site. If you do not have the password, please call the hospital operator.  08/21/2024, 5:07 PM

## 2024-08-21 NOTE — Progress Notes (Signed)
 ANTICOAGULATION CONSULT NOTE  Pharmacy Consult for Heparin  Indication: chest pain/ACS  No Known Allergies  Patient Measurements: Height: 5' 5 (165.1 cm) Weight: 71.4 kg (157 lb 6.5 oz) IBW/kg (Calculated) : 57 Heparin  Dosing Weight: 71.7 kg  Vital Signs: Temp: 98.2 F (36.8 C) (11/25 2303) Temp Source: Oral (11/25 2303) BP: 117/48 (11/25 2303) Pulse Rate: 58 (11/25 2303)  Labs: Recent Labs    08/20/24 1138 08/20/24 1327 08/20/24 1548 08/20/24 2350  HGB 13.8  --   --   --   HCT 42.1  --   --   --   PLT 261  --   --   --   HEPARINUNFRC  --   --   --  <0.10*  CREATININE 0.86  --   --   --   TROPONINIHS 4,672* 6,481* 7,370*  --     Estimated Creatinine Clearance: 37.1 mL/min (by C-G formula based on SCr of 0.86 mg/dL).   Medical History: Past Medical History:  Diagnosis Date   Arthritis    CHF (congestive heart failure) (HCC)    Diabetes mellitus without complication (HCC)    Hypertension    Assessment: 40 yof with a history of DM, HTN, HF. Patient is presenting with chest pain. Heparin  per pharmacy consult placed for chest pain/ACS. Patient is not on anticoagulation prior to arrival. Hgb 13.8; plt 261  AM: heparin  level undetectable on 850 units/hr. Per RN, no signs/symptoms of bleeding but heparin  infusion has been occluding. RN has since moved heparin  IV to be able to run continuously starting at 0200. Last CBC stable  Goal of Therapy:  Heparin  level 0.3-0.7 units/ml Monitor platelets by anticoagulation protocol: Yes   Plan:  Continue heparin  infusion at 850 units/hr and recheck level at steady state Check anti-Xa level in 8 hours and daily while on heparin  Continue to monitor H&H and platelets Plan per cards to run heparin  for 48 hours (entered end date and time)  Lynwood Poplar, PharmD, BCPS Clinical Pharmacist 08/21/2024 12:21 AM

## 2024-08-22 DIAGNOSIS — I5030 Unspecified diastolic (congestive) heart failure: Secondary | ICD-10-CM | POA: Diagnosis not present

## 2024-08-22 DIAGNOSIS — I214 Non-ST elevation (NSTEMI) myocardial infarction: Secondary | ICD-10-CM | POA: Diagnosis not present

## 2024-08-22 DIAGNOSIS — E782 Mixed hyperlipidemia: Secondary | ICD-10-CM | POA: Diagnosis not present

## 2024-08-22 DIAGNOSIS — I10 Essential (primary) hypertension: Secondary | ICD-10-CM | POA: Diagnosis not present

## 2024-08-22 LAB — BASIC METABOLIC PANEL WITH GFR
Anion gap: 12 (ref 5–15)
BUN: 13 mg/dL (ref 8–23)
CO2: 23 mmol/L (ref 22–32)
Calcium: 8.7 mg/dL — ABNORMAL LOW (ref 8.9–10.3)
Chloride: 101 mmol/L (ref 98–111)
Creatinine, Ser: 0.94 mg/dL (ref 0.44–1.00)
GFR, Estimated: 55 mL/min — ABNORMAL LOW (ref >60)
Glucose, Bld: 154 mg/dL — ABNORMAL HIGH (ref 70–99)
Potassium: 3.6 mmol/L (ref 3.5–5.1)
Sodium: 136 mmol/L (ref 135–145)

## 2024-08-22 LAB — CBC
HCT: 41 % (ref 36.0–46.0)
Hemoglobin: 13.8 g/dL (ref 12.0–15.0)
MCH: 31.5 pg (ref 26.0–34.0)
MCHC: 33.7 g/dL (ref 30.0–36.0)
MCV: 93.6 fL (ref 80.0–100.0)
Platelets: 256 K/uL (ref 150–400)
RBC: 4.38 MIL/uL (ref 3.87–5.11)
RDW: 13.4 % (ref 11.5–15.5)
WBC: 9.1 K/uL (ref 4.0–10.5)
nRBC: 0 % (ref 0.0–0.2)

## 2024-08-22 LAB — GLUCOSE, CAPILLARY
Glucose-Capillary: 128 mg/dL — ABNORMAL HIGH (ref 70–99)
Glucose-Capillary: 282 mg/dL — ABNORMAL HIGH (ref 70–99)
Glucose-Capillary: 195 mg/dL — ABNORMAL HIGH (ref 70–99)

## 2024-08-22 LAB — PHOSPHORUS: Phosphorus: 2.7 mg/dL (ref 2.5–4.6)

## 2024-08-22 LAB — MAGNESIUM: Magnesium: 1.9 mg/dL (ref 1.7–2.4)

## 2024-08-22 LAB — HEPARIN LEVEL (UNFRACTIONATED): Heparin Unfractionated: 0.16 [IU]/mL — ABNORMAL LOW (ref 0.30–0.70)

## 2024-08-22 MED ORDER — LOSARTAN POTASSIUM 25 MG PO TABS
25.0000 mg | ORAL_TABLET | Freq: Every day | ORAL | Status: DC
  Administered 2024-08-22 – 2024-08-23 (×2): 25 mg via ORAL
  Filled 2024-08-22 (×2): qty 1

## 2024-08-22 NOTE — Progress Notes (Addendum)
   08/22/24 1324  TOC Brief Assessment  Insurance and Status Reviewed  Patient has primary care physician Yes  Home environment has been reviewed has apartment [facility]  Prior level of function: independent  Prior/Current Home Services No current home services  Social Drivers of Health Review SDOH reviewed no interventions necessary  Readmission risk has been reviewed Yes  Transition of care needs no transition of care needs at this time    Pt has family to assist on discharge, no anticipated needs. Has needed DME including home 02- no new needs noted.

## 2024-08-22 NOTE — Plan of Care (Signed)
   Problem: Fluid Volume: Goal: Ability to maintain a balanced intake and output will improve Outcome: Progressing   Problem: Nutritional: Goal: Maintenance of adequate nutrition will improve Outcome: Progressing   Problem: Tissue Perfusion: Goal: Adequacy of tissue perfusion will improve Outcome: Progressing

## 2024-08-22 NOTE — Consult Note (Signed)
 Consultation Note Date: 08/23/2024   Patient Name: Catherine Vazquez  DOB: 11/08/1924  MRN: [id number]  Age / Sex: 89 y.o., female  PCP: Ilah Crigler, MD Referring Physician: Perri DELENA Meliton Mickey., *  Reason for Consultation: Establishing goals of care  HPI/Patient Profile: 89 y.o. female  with past medical history of prior CVA, HTN, HLD, DM-2, chronic hypoxic respiratory failure on 2 L of oxygen at baseline  admitted on 08/20/2024 with left-sided chest pain. Diagnosed with NSTEMI, being treated medically.  PMT consulted for assistance with GOC discussions.   Clinical Assessment and Goals of Care: I have reviewed medical records including EPIC notes, labs and imaging, received report from RN, assessed the patient and then met with patient  to discuss diagnosis prognosis, GOC, EOL wishes, disposition and options.  I introduced Palliative Medicine as specialized medical care for people living with serious illness. It focuses on providing relief from the symptoms and stress of a serious illness. The goal is to improve quality of life for both the patient and the family.  It is clear to me patient is unable to independently discuss GOC without family support. She is unable to state why she is in the hospital. Briefly reviewed with patient her medical diagnoses. We discuss sports - she tells me she loves to watch basketball and hates football.   Call to family - granddaughter Donita. Introduced palliative medicine to her as well and discussed intent to further discuss goals of care with patient while family present. She is agreeable to this. Open to meeting 11/28 at 10 am.   Questions and concerns were addressed. The family was encouraged to call with questions or concerns.    Primary Decision Maker NEXT OF KIN    SUMMARY OF RECOMMENDATIONS   - ongoing GOC discussions, meeting with patient and family together 11/28  at 10 am  Code  Status/Advance Care Planning: Full code     Primary Diagnoses: Present on Admission:  NSTEMI, initial episode of care [facility])  CHF with left ventricular diastolic dysfunction, NYHA class 2 (HCC)  HTN (hypertension)   I have reviewed the medical record, interviewed the patient and family, and examined the patient. The following aspects are pertinent.  Past Medical History:  Diagnosis Date   Arthritis    CHF (congestive heart failure) (HCC)    Diabetes mellitus without complication (HCC)    Hypertension    Social History   Socioeconomic History   Marital status: Married    Spouse name: Not on file   Number of children: Not on file   Years of education: Not on file   Highest education level: Not on file  Occupational History   Not on file  Tobacco Use   Smoking status: Never   Smokeless tobacco: Never  Substance and Sexual Activity   Alcohol use: No   Drug use: No   Sexual activity: Not on file  Other Topics Concern   Not on file  Social History Narrative   Right handed   Drinks sprite   [facility]dependent center 2nd floor   Social Drivers of Health   Financial Resource Strain: Not on file  Food Insecurity: No Food Insecurity (08/20/2024)   Hunger Vital Sign    Worried About Running Out of Food in the Last Year: Never true    Ran Out of Food in the Last Year: Never true  Transportation Needs: No Transportation Needs (08/20/2024)   PRAPARE - Administrator, Civil Service (Medical): No  Lack of Transportation (Non-Medical): No  Physical Activity: Not on file  Stress: Not on file  Social Connections: Moderately Integrated (08/20/2024)   Social Connection and Isolation Panel    Frequency of Communication with Friends and Family: More than three times a week    Frequency of Social Gatherings with Friends and Family: More than three times a week    Attends Religious Services: More than 4 times per year    Active Member of [employer] or Organizations: Yes     Attends Banker Meetings: 1 to 4 times per year    Marital Status: Widowed   Family History  Family history unknown: Yes   Scheduled Meds:  aspirin  EC  81 mg Oral Daily   atorvastatin   40 mg Oral Daily   clopidogrel   75 mg Oral Daily   insulin  aspart  0-6 Units Subcutaneous TID WC   latanoprost   1 drop Both Eyes QHS   losartan   25 mg Oral Daily   sodium chloride  flush  3 mL Intravenous Q12H   Continuous Infusions: PRN Meds:.acetaminophen  **OR** acetaminophen , albuterol , morphine  injection, ondansetron  **OR** ondansetron  (ZOFRAN ) IV, mouth rinse, oxyCODONE , polyethylene glycol, sodium chloride  flush No Known Allergies Review of Systems  Respiratory:  Negative for shortness of breath.   Cardiovascular:  Negative for chest pain.  Neurological:  Positive for weakness.    Physical Exam Constitutional:      General: She is not in acute distress.    Appearance: She is ill-appearing.  Pulmonary:     Effort: Pulmonary effort is normal.  Skin:    General: Skin is warm and dry.  Neurological:     Mental Status: She is alert.     Comments: Confused about situation but oriented to person and place     Vital Signs: BP 131/60 (BP Location: Left Arm)   Pulse 74   Temp 98.8 F (37.1 C) (Oral)   Resp 16   Ht 5' 5 (1.651 m)   Wt 69.8 kg   SpO2 100%   BMI 25.61 kg/m  Pain Scale: 0-10   Pain Score: 0-No pain   SpO2: SpO2: 100 % O2 Device:SpO2: 100 % O2 Flow Rate: .O2 Flow Rate (L/min): 1 L/min  IO: Intake/output summary:  Intake/Output Summary (Last 24 hours) at 08/23/2024 9075 Last data filed at 08/23/2024 9671 Gross per 24 hour  Intake 81.81 ml  Output 500 ml  Net -418.19 ml    LBM: Last BM Date : 08/19/24 Baseline Weight: Weight: 72.7 kg Most recent weight: Weight: 69.8 kg     Palliative Assessment/Data: PPS 50%     *Please note that this is a verbal dictation therefore any spelling or grammatical errors are due to the [employer] One system  interpretation.   Time Total: 40 minutes Time spent includes: Detailed review of medical records (labs, imaging, vital signs), medically appropriate exam, discussion with treatment team, counseling and educating patient, family and/or staff, documenting clinical information, medication management and coordination of care.    Tobey Jama Barnacle, DNP, AGNP-C Palliative Medicine Team [phone] Pager: [phone]

## 2024-08-22 NOTE — Progress Notes (Signed)
  Progress Note  Patient Name: Catherine Vazquez Date of Encounter: 08/22/2024 [facility] Cardiologist: Alm Clay, MD   Interval Summary    Comfortable sitting up in bed eating breakfast.   Vital Signs Vitals:   08/21/24 2018 08/21/24 2318 08/22/24 0318 08/22/24 0722  BP: 125/66 137/68 (!) 152/57 (!) 153/63  Pulse: 96 73 61 62  Resp: 16 16 20 18   Temp: 98.5 F (36.9 C) 98.3 F (36.8 C) 98.5 F (36.9 C) 98.3 F (36.8 C)  TempSrc: Oral Oral Oral Oral  SpO2: 98% 99% 95% 99%  Weight:      Height:        Intake/Output Summary (Last 24 hours) at 08/22/2024 0932 Last data filed at 08/21/2024 1900 Gross per 24 hour  Intake 418.11 ml  Output --  Net 418.11 ml      08/20/2024    8:56 PM 08/20/2024    3:50 PM 11/17/2021   10:32 AM  Last 3 Weights  Weight (lbs) 157 lb 6.5 oz 160 lb 4.4 oz 167 lb  Weight (kg) 71.4 kg 72.7 kg 75.751 kg     Cardiac Studies Echocardiogram: EF 60 to 65%.  Anterolateral hypokinesis.  Mild LVH.  GR 2 DD.  Elevated LAP.  Severely evaded PAP.  Moderate MAC.  AoV sclerosis without stenosis.   Telemetry/ECG  Continues to have sinus bradycardia with underlying competing atrial pacemaker with likely Mobitz type I AV block/blocked PACs along with multifocal PVCs - Personally Reviewed  Physical Exam  GEN: No acute distress.  Resting comfortably in bed. Neck: No JVD or bruit Cardiac: Irreg rhythm with rate control.,  Soft 1/6 SEM at RUSB.  No rubs, or gallops.  Respiratory: Clear to auscultation bilaterally. GI: Soft, nontender, non-distended  MS: No edema  Assessment & Plan   89 y.o. female with a hx of type 2 diabetes, hypertension, prior CVA in 2018 and arthritis who was seen 08/20/2024 for the evaluation of NSTEMI at the request of Lynwood Moulder MD.  -- Presented with episodes of chest pain over the past week;   NSTEMI: Peak troponin just over 7000.  Has remained chest pain-free during hospitalization -- Echocardiogram shows  preserved EF with anterolateral hypokinesis suggesting probably either high OM/diagonal or ramus or medius disease. - Based on discussion with patient and family, plan is for early noninvasive/conservative management with no plans for invasive procedures. Complete 48-hour IV heparin .  On aspirin  81 mg plus Plavix  75 mg after load 300 mg yesterday. Atorvastatin  40 mg  Avoiding AV nodal agents due to conduction disease, will add low-dose ARB today as her blood pressures have been higher.   Conduction disease -- EKG on admission possible second-degree AV block Mobitz type I with ventricular escape rhythm versus 2-1 AV block.  EKG was reviewed with EP felt that this was consistent with competing atrial pacemaker with underlying first-degree block and intermittent wenkebach.  -- avoid AVN agents - Has not had any prolonged pauses.  Can consider short-term home event monitor  Hyperlipidemia:  LDL 150, HDL 60 On atorvastatin  80 mg daily.  For questions or updates, please contact [facility] Please consult [email or web address] for contact info under        Signed, Alm Clay, MD Alm MICAEL Clay, MD, MS Alm Clay, M.D., M.S. Interventional Cardiologist  [facility] Pager # [phone]

## 2024-08-22 NOTE — Progress Notes (Signed)
 ANTICOAGULATION CONSULT NOTE  Pharmacy Consult for Heparin  Indication: chest pain/ACS  No Known Allergies  Patient Measurements: Height: 5' 5 (165.1 cm) Weight: 71.4 kg (157 lb 6.5 oz) IBW/kg (Calculated) : 57 Heparin  Dosing Weight: 71.7 kg  Vital Signs: Temp: 98.3 F (36.8 C) (11/27 0722) Temp Source: Oral (11/27 0722) BP: 153/63 (11/27 0722) Pulse Rate: 62 (11/27 0722)  Labs: Recent Labs    08/20/24 1138 08/20/24 1327 08/20/24 1548 08/20/24 2350 08/21/24 1023 08/22/24 0341  HGB 13.8  --   --  13.4  --  13.8  HCT 42.1  --   --  40.0  --  41.0  PLT 261  --   --  254  --  256  HEPARINUNFRC  --   --   --  <0.10* 0.37 0.16*  CREATININE 0.86  --   --  0.87  --  0.94  TROPONINIHS 4,672* 6,481* 7,370*  --   --   --     Estimated Creatinine Clearance: 33.9 mL/min (by C-G formula based on SCr of 0.94 mg/dL).   Medical History: Past Medical History:  Diagnosis Date   Arthritis    CHF (congestive heart failure) (HCC)    Diabetes mellitus without complication (HCC)    Hypertension    Assessment: 89 yo F with a history of DM, HTN, HF. Patient is presenting with chest pain. Heparin  per pharmacy consult placed for chest pain/ACS. Patient is not on anticoagulation prior to arrival.   Heparin  level subtherapeutic at 0.16 at 850 units/hr. No interruptions or issues with the infusion or signs/symptoms of bleeding per RN. Will increase to get to goal. Deffering bolus in setting of increased risk of bleeding with age and anticipated discontinuation of heparin  later today. -plans for 48 hrs heparin   Goal of Therapy:  Heparin  level 0.3-0.7 units/ml Monitor platelets by anticoagulation protocol: Yes   Plan:  -Continue heparin  at 1100 units/hr -Monitor heparin  level and CBC -Heparin  stop time 11/27 at 4:30pm  Elma Fail, PharmD PGY1 Clinical Pharmacist [facility]  08/22/2024 8:23 AM

## 2024-08-22 NOTE — Progress Notes (Signed)
 PROGRESS NOTE    Catherine Vazquez  FMW:[id number] DOB: December 21, 1924 DOA: 08/20/2024 PCP: Catherine Crigler, MD  Chief Complaint  Patient presents with   Chest Pain    Brief Narrative:   Catherine Vazquez is Catherine Vazquez 89 y.o. female with medical history significant of prior CVA, HTN, HLD, DM-2, chronic hypoxic respiratory failure on 2 L of oxygen at baseline who presented to the ED with left-sided chest pain.   She's been admitted with an NSTEMI.  Planning medical therapy.    Assessment & Plan:   Principal Problem:   NSTEMI, initial episode of care Catherine Vazquez) Active Problems:   HTN (hypertension)   CHF with left ventricular diastolic dysfunction, NYHA class 2 (HCC)   HLD (hyperlipidemia)   DM (diabetes mellitus), type 2 (HCC)  Goals of Care Sounds like patient and family were not in favor of any procedures, though apparently on admission Catherine Vazquez said she wanted everything done (when considering code status).  Discussed with granddaughter today, some concern regarding grandmothers ability to completely understand discussion.  I think DNR would be reasonable as this would be consistent with the conservative and appropriate approach taken at admission to avoid procedures including cath.  Granddaughter Catherine Vazquez) and Grandson (Catherine Vazquez (sp?)) it sounds like are the next of kin, will ask palliative to help with additional GOC conversations. Plan for return to ILF, likely 11/28  NSTEMI CP free at this time Troponin elevated to 7370 Seen by cardiology, planning 48 hrs heparin , aspirin , plavix , statin Echo with EF 60-65%, regional wall motion abnormalities (hypokinesis of anterolateral wall, but poor visualization)  Conduction Disease Appreciate cardiology assistance Holding AV nodal blockers Cards considering short term home monitor  First Degree AV Block RBBB  Dyslipidemia Statin  3 mm LUL Pulmonary Nodule Outpatient follow up      DVT prophylaxis: heparin  gtt Code Status: full Family  Communication: granddaughter at bedside Disposition:   Status is: Inpatient Remains inpatient appropriate because: need for continued cardiology care   Consultants:  cards  Procedures:  Echo IMPRESSIONS     1. Left ventricular ejection fraction, by estimation, is 60 to 65%. The  left ventricle has normal function. The left ventricle demonstrates  regional wall motion abnormalities. Appears hypokinesis of anterolateral  wall but poor visualization; overall LV  systolic function appears preserved. There is moderate left ventricular  hypertrophy. Left ventricular diastolic parameters are consistent with  Grade II diastolic dysfunction (pseudonormalization). Elevated left atrial  pressure.   2. Right ventricular systolic function is normal. The right ventricular  size is normal. There is severely elevated pulmonary artery systolic  pressure.   3. The mitral valve is degenerative. Trivial mitral valve regurgitation.  No evidence of mitral stenosis. Moderate mitral annular calcification.   4. The aortic valve is tricuspid. Aortic valve regurgitation is not  visualized. Aortic valve sclerosis is present, with no evidence of aortic  valve stenosis.   Antimicrobials:  Anti-infectives (From admission, onward)    None       Subjective: Denies CP  Objective: Vitals:   08/21/24 2318 08/22/24 0318 08/22/24 0722 08/22/24 1145  BP: 137/68 (!) 152/57 (!) 153/63 122/77  Pulse: 73 61 62   Resp: 16 20 18 18   Temp: 98.3 F (36.8 C) 98.5 F (36.9 C) 98.3 F (36.8 C) 97.9 F (36.6 C)  TempSrc: Oral Oral Oral Oral  SpO2: 99% 95% 99%   Weight:      Height:        Intake/Output Summary (Last 24 hours)  at 08/22/2024 1447 Last data filed at 08/22/2024 1148 Gross per 24 hour  Intake 419.19 ml  Output 200 ml  Net 219.19 ml   Filed Weights   08/20/24 1550 08/20/24 2056  Weight: 72.7 kg 71.4 kg    Examination:  General: No acute distress. Cardiovascular: RRR Lungs:  unlabored Neurological: Alert and oriented 3. Moves all extremities 4 with equal strength. Cranial nerves II through XII grossly intact. Extremities: No clubbing or cyanosis. No edema  Data Reviewed: I have personally reviewed following labs and imaging studies  CBC: Recent Labs  Lab 08/20/24 1138 08/20/24 2350 08/22/24 0341  WBC 5.1 6.4 9.1  HGB 13.8 13.4 13.8  HCT 42.1 40.0 41.0  MCV 95.5 93.9 93.6  PLT 261 254 256    Basic Metabolic Panel: Recent Labs  Lab 08/20/24 1138 08/20/24 2350 08/22/24 0341  NA 139 138 136  K 4.1 4.0 3.6  CL 103 102 101  CO2 25 26 23   GLUCOSE 136* 154* 154*  BUN 14 14 13   CREATININE 0.86 0.87 0.94  CALCIUM  9.0 8.8* 8.7*  MG  --   --  1.9  PHOS  --   --  2.7    GFR: Estimated Creatinine Clearance: 33.9 mL/min (by C-G formula based on SCr of 0.94 mg/dL).  Liver Function Tests: Recent Labs  Lab 08/20/24 1138  AST 71*  ALT 16  ALKPHOS 67  BILITOT 0.8  PROT 6.5  ALBUMIN 3.4*    CBG: Recent Labs  Lab 08/21/24 1256 08/21/24 1626 08/21/24 2125 08/22/24 0806 08/22/24 1146  GLUCAP 183* 158* 186* 128* 282*     No results found for this or any previous visit (from the past 240 hours).       Radiology Studies: ECHOCARDIOGRAM COMPLETE Result Date: 08/21/2024    ECHOCARDIOGRAM REPORT   Patient Name:   Catherine Vazquez Catherine Vazquez Date of Exam: 08/21/2024 Medical Rec #:  [id number]         Height:       65.0 in Accession #:    [id number]        Weight:       157.4 lb Date of Birth:  31-Aug-1925        BSA:          1.787 m Patient Age:    89 years          BP:           131/51 mmHg Patient Gender: F                 HR:           62 bpm. Exam Location:  Inpatient Procedure: 2D Echo, Cardiac Doppler and Color Doppler (Both Spectral and Color            Flow Doppler were utilized during procedure). Indications:    NSTEMI  History:        Patient has prior history of Echocardiogram examinations, most                 recent 01/02/2020. CHF, Acute MI,  Signs/Symptoms:Dyspnea; Risk                 Factors:Hypertension, Diabetes and Dyslipidemia.  Sonographer:    Catherine Vazquez Referring Phys: [phone] Catherine Vazquez  Sonographer Comments: No subcostal window. IMPRESSIONS  1. Left ventricular ejection fraction, by estimation, is 60 to 65%. The left ventricle has normal function. The left ventricle demonstrates regional wall motion abnormalities. Appears hypokinesis  of anterolateral wall but poor visualization; overall LV systolic function appears preserved. There is moderate left ventricular hypertrophy. Left ventricular diastolic parameters are consistent with Grade II diastolic dysfunction (pseudonormalization). Elevated left atrial pressure.  2. Right ventricular systolic function is normal. The right ventricular size is normal. There is severely elevated pulmonary artery systolic pressure.  3. The mitral valve is degenerative. Trivial mitral valve regurgitation. No evidence of mitral stenosis. Moderate mitral annular calcification.  4. The aortic valve is tricuspid. Aortic valve regurgitation is not visualized. Aortic valve sclerosis is present, with no evidence of aortic valve stenosis. FINDINGS  Left Ventricle: Left ventricular ejection fraction, by estimation, is 60 to 65%. The left ventricle has normal function. The left ventricle demonstrates regional wall motion abnormalities. The left ventricular internal cavity size was small. There is moderate left ventricular hypertrophy. Left ventricular diastolic parameters are consistent with Grade II diastolic dysfunction (pseudonormalization). Elevated left atrial pressure. Right Ventricle: The right ventricular size is normal. No increase in right ventricular wall thickness. Right ventricular systolic function is normal. There is severely elevated pulmonary artery systolic pressure. The tricuspid regurgitant velocity is 3.74 m/s, and with an assumed right atrial pressure of 8 mmHg, the estimated right ventricular  systolic pressure is 64.0 mmHg. Left Atrium: Left atrial size was normal in size. Right Atrium: Right atrial size was normal in size. Pericardium: There is no evidence of pericardial effusion. Mitral Valve: The mitral valve is degenerative in appearance. Moderate mitral annular calcification. Trivial mitral valve regurgitation. No evidence of mitral valve stenosis. Tricuspid Valve: The tricuspid valve is normal in structure. Tricuspid valve regurgitation is mild. Aortic Valve: The aortic valve is tricuspid. Aortic valve regurgitation is not visualized. Aortic valve sclerosis is present, with no evidence of aortic valve stenosis. Pulmonic Valve: The pulmonic valve was not well visualized. Pulmonic valve regurgitation is trivial. Aorta: The aortic root and ascending aorta are structurally normal, with no evidence of dilitation. IAS/Shunts: The interatrial septum was not well visualized.  LEFT VENTRICLE PLAX 2D LVIDd:         3.50 cm   Diastology LVIDs:         2.20 cm   LV e' medial:    5.43 cm/s LV PW:         1.30 cm   LV E/e' medial:  16.9 LV IVS:        1.00 cm   LV e' lateral:   7.62 cm/s LVOT diam:     2.00 cm   LV E/e' lateral: 12.0 LV SV:         54 LV SV Index:   30 LVOT Area:     3.14 cm  RIGHT VENTRICLE RV Basal diam:  3.50 cm RV Mid diam:    3.00 cm LEFT ATRIUM             Index        RIGHT ATRIUM           Index LA Vol (A2C):   53.1 ml 29.72 ml/m  RA Area:     12.50 cm LA Vol (A4C):   26.5 ml 14.83 ml/m  RA Volume:   33.70 ml  18.86 ml/m LA Biplane Vol: 40.2 ml 22.50 ml/m  AORTIC VALVE LVOT Vmax:   78.60 cm/s LVOT Vmean:  51.900 cm/s LVOT VTI:    0.173 m  AORTA Ao Root diam: 3.30 cm Ao Asc diam:  3.00 cm MITRAL VALVE  TRICUSPID VALVE MV Area (PHT): 3.60 cm    TR Peak grad:   56.0 mmHg MV Decel Time: 211 msec    TR Vmax:        374.00 cm/s MV E velocity: 91.80 cm/s MV Kiora Hallberg velocity: 98.50 cm/s  SHUNTS MV E/Tyreese Thain ratio:  0.93        Systemic VTI:  0.17 m                            Systemic  Diam: 2.00 cm Lonni Nanas MD Electronically signed by Lonni Nanas MD Signature Date/Time: 08/21/2024/10:12:58 AM    Final    CT CHEST WO CONTRAST Result Date: 08/20/2024 EXAM: CT CHEST WITHOUT CONTRAST 08/20/2024 05:37:00 PM TECHNIQUE: CT of the chest was performed without the administration of intravenous contrast. Multiplanar reformatted images are provided for review. Automated exposure control, iterative reconstruction, and/or weight based adjustment of the mA/kV was utilized to reduce the radiation dose to as low as reasonably achievable. COMPARISON: None available. CLINICAL HISTORY: Dyspnea, chronic, unclear etiology. FINDINGS: MEDIASTINUM: Heart: Extensive multivessel coronary artery calcification. Global cardiac size within normal limits. No pericardial effusion. Central pulmonary arteries are of normal caliber. Mild atherosclerotic calcification within the thoracic aorta. No aortic aneurysm. The central airways are clear. No symptoms of obstructing lesion. LYMPH NODES: No mediastinal, hilar or axillary lymphadenopathy. LUNGS AND PLEURA: Small bibasilar cylindrical bronchiectasis and subpleural fibrotic change, likely post inflammatory in nature. No superimposed acute pulmonary infiltrate. No pulmonary edema. No pleural effusion or pneumothorax. SOFT TISSUES/BONES: Osseous structures are age appropriate with advanced degenerative changes within the thoracic spine and shoulders bilaterally. No acute bone abnormality. No lytic or blastic bone lesion. No acute abnormality of the soft tissues. UPPER ABDOMEN: Limited images of the upper abdomen demonstrates no acute abnormality. IMPRESSION: 1. No acute findings. 2. Mild bibasilar post-inflammatory change. No acute pulmonary infiltrate. 3. Extensive multivessel coronary artery calcification. Electronically signed by: Dorethia Molt MD 08/20/2024 05:58 PM EST RP Workstation: [id number]        Scheduled Meds:  aspirin  EC  81 mg Oral  Daily   atorvastatin   40 mg Oral Daily   clopidogrel   75 mg Oral Daily   insulin  aspart  0-6 Units Subcutaneous TID WC   latanoprost   1 drop Both Eyes QHS   losartan   25 mg Oral Daily   sodium chloride  flush  3 mL Intravenous Q12H   Continuous Infusions:  heparin  1,100 Units/hr (08/22/24 1100)     LOS: 2 days    Time spent: over 30 min     Meliton Monte, MD [facility]   To contact the attending provider between 7A-7P or the covering provider during after hours 7P-7A, please log into the web site [email or web address] and access using universal [place] password for that web site. If you do not have the password, please call the Vazquez operator.  08/22/2024, 2:47 PM

## 2024-08-23 ENCOUNTER — Other Ambulatory Visit (HOSPITAL_COMMUNITY): Payer: Self-pay

## 2024-08-23 DIAGNOSIS — I10 Essential (primary) hypertension: Secondary | ICD-10-CM | POA: Diagnosis not present

## 2024-08-23 DIAGNOSIS — I441 Atrioventricular block, second degree: Secondary | ICD-10-CM

## 2024-08-23 DIAGNOSIS — I214 Non-ST elevation (NSTEMI) myocardial infarction: Secondary | ICD-10-CM | POA: Diagnosis not present

## 2024-08-23 DIAGNOSIS — I5030 Unspecified diastolic (congestive) heart failure: Secondary | ICD-10-CM | POA: Diagnosis not present

## 2024-08-23 DIAGNOSIS — E782 Mixed hyperlipidemia: Secondary | ICD-10-CM | POA: Diagnosis not present

## 2024-08-23 LAB — HEPARIN LEVEL (UNFRACTIONATED): Heparin Unfractionated: <0.1 [IU]/mL — ABNORMAL LOW (ref 0.30–0.70)

## 2024-08-23 LAB — CBC
HCT: 38.8 % (ref 36.0–46.0)
Hemoglobin: 13.1 g/dL (ref 12.0–15.0)
MCH: 31.5 pg (ref 26.0–34.0)
MCHC: 33.8 g/dL (ref 30.0–36.0)
MCV: 93.3 fL (ref 80.0–100.0)
Platelets: 241 K/uL (ref 150–400)
RBC: 4.16 MIL/uL (ref 3.87–5.11)
RDW: 13.5 % (ref 11.5–15.5)
WBC: 7.2 K/uL (ref 4.0–10.5)
nRBC: 0 % (ref 0.0–0.2)

## 2024-08-23 LAB — GLUCOSE, CAPILLARY: Glucose-Capillary: 139 mg/dL — ABNORMAL HIGH (ref 70–99)

## 2024-08-23 MED ORDER — ATORVASTATIN CALCIUM 40 MG PO TABS
40.0000 mg | ORAL_TABLET | Freq: Every day | ORAL | 0 refills | Status: AC
  Filled 2024-08-23: qty 90, 90d supply, fill #0

## 2024-08-23 MED ORDER — CLOPIDOGREL BISULFATE 75 MG PO TABS
75.0000 mg | ORAL_TABLET | Freq: Every day | ORAL | 0 refills | Status: AC
  Filled 2024-08-23: qty 90, 90d supply, fill #0

## 2024-08-23 MED ORDER — ASPIRIN 81 MG PO TBEC
81.0000 mg | DELAYED_RELEASE_TABLET | Freq: Every day | ORAL | 0 refills | Status: AC
  Filled 2024-08-23: qty 90, 90d supply, fill #0

## 2024-08-23 MED ORDER — LOSARTAN POTASSIUM 25 MG PO TABS
25.0000 mg | ORAL_TABLET | Freq: Every day | ORAL | 0 refills | Status: AC
  Filled 2024-08-23: qty 90, 90d supply, fill #0

## 2024-08-23 MED ORDER — PANTOPRAZOLE SODIUM 40 MG PO TBEC
40.0000 mg | DELAYED_RELEASE_TABLET | Freq: Every day | ORAL | 1 refills | Status: AC
  Filled 2024-08-23: qty 30, 30d supply, fill #0

## 2024-08-23 NOTE — Discharge Summary (Signed)
 Physician Discharge Summary  TIONNA GIGANTE FMW:[id number] DOB: 09-Jan-1925 DOA: 08/20/2024  PCP: Ilah Crigler, MD  Admit date: 08/20/2024 Discharge date: 08/23/2024  Time spent: 40 minutes  Recommendations for Outpatient Follow-up:  Follow outpatient CBC/CMP  Follow with cardiology outpatient Follow final palliative care note, DNR after discussion with palliative   Discharge Diagnoses:  Principal Problem:   NSTEMI, initial episode of care [facility]) Active Problems:   HTN (hypertension)   CHF with left ventricular diastolic dysfunction, NYHA class 2 (HCC)   HLD (hyperlipidemia)   DM (diabetes mellitus), type 2 (HCC)   Discharge Condition: stable  Diet recommendation: heart healthy  Filed Weights   08/20/24 1550 08/20/24 2056 08/23/24 0328  Weight: 72.7 kg 71.4 kg 69.8 kg    History of present illness:   Catherine Vazquez is Catherine Vazquez 89 y.o. female with medical history significant of prior CVA, HTN, HLD, DM-2, chronic hypoxic respiratory failure on 2 L of oxygen at baseline who presented to the ED with left-sided chest pain.    Catherine's been admitted with an NSTEMI.  S/p medical therapy.   See below for additional details.   Hospital Course:  Assessment and Plan:  Goals of Care Appreciate palliative care's discussion with [name] and her grandchildren, final note is pending, but per my verbal discussion, they're all in agreement with DNR.  This seems consistent with her initial plan of care.  Follow final palliative care note.    NSTEMI CP free at this time Troponin elevated to 7370 S/p 48 hrs heparin .  Discharge with DAPT, statin, losartan .  No AV nodal blocker in setting of below.   Echo with EF 60-65%, regional wall motion abnormalities (hypokinesis of anterolateral wall, but poor visualization) Follow with cardiology outpatient    Conduction Disease Appreciate cardiology assistance Holding AV nodal blockers Cards considering short term home monitor Per cards  outpatient   First Degree AV Block RBBB   Dyslipidemia Statin   3 mm LUL Pulmonary Nodule Outpatient follow up      Procedures: Echo IMPRESSIONS     1. Left ventricular ejection fraction, by estimation, is 60 to 65%. The  left ventricle has normal function. The left ventricle demonstrates  regional wall motion abnormalities. Appears hypokinesis of anterolateral  wall but poor visualization; overall LV  systolic function appears preserved. There is moderate left ventricular  hypertrophy. Left ventricular diastolic parameters are consistent with  Grade II diastolic dysfunction (pseudonormalization). Elevated left atrial  pressure.   2. Right ventricular systolic function is normal. The right ventricular  size is normal. There is severely elevated pulmonary artery systolic  pressure.   3. The mitral valve is degenerative. Trivial mitral valve regurgitation.  No evidence of mitral stenosis. Moderate mitral annular calcification.   4. The aortic valve is tricuspid. Aortic valve regurgitation is not  visualized. Aortic valve sclerosis is present, with no evidence of aortic  valve stenosis.   Consultations: Cardiology Palliative care  Discharge Exam: Vitals:   08/23/24 0328 08/23/24 0812  BP:  131/60  Pulse: 63 74  Resp: 16 16  Temp: 97.8 F (36.6 C) 98.8 F (37.1 C)  SpO2: 100%    No new complaints Eager to discharge home Grandson at bedside  General: No acute distress. Cardiovascular: RRR Lungs: unlabored Neurological: Alert and oriented 3. Moves all extremities 4 with equal strength. Cranial nerves II through XII grossly intact. Extremities: No clubbing or cyanosis. No edema.  Discharge Instructions   Discharge Instructions     Call MD  for:  difficulty breathing, headache or visual disturbances   Complete by: As directed    Call MD for:  extreme fatigue   Complete by: As directed    Call MD for:  hives   Complete by: As directed    Call MD for:   persistant dizziness or light-headedness   Complete by: As directed    Call MD for:  persistant nausea and vomiting   Complete by: As directed    Call MD for:  redness, tenderness, or signs of infection (pain, swelling, redness, odor or green/yellow discharge around incision site)   Complete by: As directed    Call MD for:  severe uncontrolled pain   Complete by: As directed    Call MD for:  temperature >100.4   Complete by: As directed    Diet - low sodium heart healthy   Complete by: As directed    Discharge instructions   Complete by: As directed    You were seen for [name] heart attack.  We treated you medically with heparin , aspirin , and plavix .  We'll send you home on aspirin , plavix , and lipitor.  We'll also start Camaria Gerald low dose blood pressure medicine called losartan .  You should follow up with cardiology outpatient.  You had [name] conversation with palliative care on the day of your discharge.  Return for new, recurrent, or worsening symptoms.  Please ask your PCP to request records from this hospitalization so they know what was done and what the next steps will be.   Increase activity slowly   Complete by: As directed       Allergies as of 08/23/2024   No Known Allergies      Medication List     TAKE these medications    acetaminophen  500 MG tablet Commonly known as: TYLENOL  Take 500-1,000 mg by mouth every 6 (six) hours as needed (for pain).   aspirin  EC 81 MG tablet Take 1 tablet (81 mg total) by mouth daily. Swallow whole. Start taking on: August 24, 2024   atorvastatin  40 MG tablet Commonly known as: LIPITOR Take 1 tablet (40 mg total) by mouth daily. Start taking on: August 24, 2024   clopidogrel  75 MG tablet Commonly known as: PLAVIX  Take 1 tablet (75 mg total) by mouth daily. Start taking on: August 24, 2024   latanoprost  0.005 % ophthalmic solution Commonly known as: XALATAN  Place 1 drop into both eyes nightly.   losartan  25 MG  tablet Commonly known as: COZAAR  Take 1 tablet (25 mg total) by mouth daily. Start taking on: August 24, 2024   pantoprazole 40 MG tablet Commonly known as: Protonix Take 1 tablet (40 mg total) by mouth daily.       No Known Allergies    The results of significant diagnostics from this hospitalization (including imaging, microbiology, ancillary and laboratory) are listed below for reference.    Significant Diagnostic Studies: ECHOCARDIOGRAM COMPLETE Result Date: 08/21/2024    ECHOCARDIOGRAM REPORT   Patient Name:   SHELL BLANCHETTE [facility] Date of Exam: 08/21/2024 Medical Rec #:  [id number]         Height:       65.0 in Accession #:    [id number]        Weight:       157.4 lb Date of Birth:  [birth year]        BSA:          1.787 m Patient Age:    89 years  BP:           131/51 mmHg Patient Gender: F                 HR:           62 bpm. Exam Location:  Inpatient Procedure: 2D Echo, Cardiac Doppler and Color Doppler (Both Spectral and Color            Flow Doppler were utilized during procedure). Indications:    NSTEMI  History:        Patient has prior history of Echocardiogram examinations, most                 recent 01/02/2020. CHF, Acute MI, Signs/Symptoms:Dyspnea; Risk                 Factors:Hypertension, Diabetes and Dyslipidemia.  Sonographer:    Juliene Rucks Referring Phys: [phone] [facility]  Sonographer Comments: No subcostal window. IMPRESSIONS  1. Left ventricular ejection fraction, by estimation, is 60 to 65%. The left ventricle has normal function. The left ventricle demonstrates regional wall motion abnormalities. Appears hypokinesis of anterolateral wall but poor visualization; overall LV systolic function appears preserved. There is moderate left ventricular hypertrophy. Left ventricular diastolic parameters are consistent with Grade II diastolic dysfunction (pseudonormalization). Elevated left atrial pressure.  2. Right ventricular systolic function is normal. The right  ventricular size is normal. There is severely elevated pulmonary artery systolic pressure.  3. The mitral valve is degenerative. Trivial mitral valve regurgitation. No evidence of mitral stenosis. Moderate mitral annular calcification.  4. The aortic valve is tricuspid. Aortic valve regurgitation is not visualized. Aortic valve sclerosis is present, with no evidence of aortic valve stenosis. FINDINGS  Left Ventricle: Left ventricular ejection fraction, by estimation, is 60 to 65%. The left ventricle has normal function. The left ventricle demonstrates regional wall motion abnormalities. The left ventricular internal cavity size was small. There is moderate left ventricular hypertrophy. Left ventricular diastolic parameters are consistent with Grade II diastolic dysfunction (pseudonormalization). Elevated left atrial pressure. Right Ventricle: The right ventricular size is normal. No increase in right ventricular wall thickness. Right ventricular systolic function is normal. There is severely elevated pulmonary artery systolic pressure. The tricuspid regurgitant velocity is 3.74 m/s, and with an assumed right atrial pressure of 8 mmHg, the estimated right ventricular systolic pressure is 64.0 mmHg. Left Atrium: Left atrial size was normal in size. Right Atrium: Right atrial size was normal in size. Pericardium: There is no evidence of pericardial effusion. Mitral Valve: The mitral valve is degenerative in appearance. Moderate mitral annular calcification. Trivial mitral valve regurgitation. No evidence of mitral valve stenosis. Tricuspid Valve: The tricuspid valve is normal in structure. Tricuspid valve regurgitation is mild. Aortic Valve: The aortic valve is tricuspid. Aortic valve regurgitation is not visualized. Aortic valve sclerosis is present, with no evidence of aortic valve stenosis. Pulmonic Valve: The pulmonic valve was not well visualized. Pulmonic valve regurgitation is trivial. Aorta: The aortic root and  ascending aorta are structurally normal, with no evidence of dilitation. IAS/Shunts: The interatrial septum was not well visualized.  LEFT VENTRICLE PLAX 2D LVIDd:         3.50 cm   Diastology LVIDs:         2.20 cm   LV e' medial:    5.43 cm/s LV PW:         1.30 cm   LV E/e' medial:  16.9 LV IVS:  1.00 cm   LV e' lateral:   7.62 cm/s LVOT diam:     2.00 cm   LV E/e' lateral: 12.0 LV SV:         54 LV SV Index:   30 LVOT Area:     3.14 cm  RIGHT VENTRICLE RV Basal diam:  3.50 cm RV Mid diam:    3.00 cm LEFT ATRIUM             Index        RIGHT ATRIUM           Index LA Vol (A2C):   53.1 ml 29.72 ml/m  RA Area:     12.50 cm LA Vol (A4C):   26.5 ml 14.83 ml/m  RA Volume:   33.70 ml  18.86 ml/m LA Biplane Vol: 40.2 ml 22.50 ml/m  AORTIC VALVE LVOT Vmax:   78.60 cm/s LVOT Vmean:  51.900 cm/s LVOT VTI:    0.173 m  AORTA Ao Root diam: 3.30 cm Ao Asc diam:  3.00 cm MITRAL VALVE               TRICUSPID VALVE MV Area (PHT): 3.60 cm    TR Peak grad:   56.0 mmHg MV Decel Time: 211 msec    TR Vmax:        374.00 cm/s MV E velocity: 91.80 cm/s MV Laurana Magistro velocity: 98.50 cm/s  SHUNTS MV E/Leean Amezcua ratio:  0.93        Systemic VTI:  0.17 m                            Systemic Diam: 2.00 cm Lonni Nanas MD Electronically signed by Lonni Nanas MD Signature Date/Time: 08/21/2024/10:12:58 AM    Final    CT CHEST WO CONTRAST Result Date: 08/20/2024 EXAM: CT CHEST WITHOUT CONTRAST 08/20/2024 05:37:00 PM TECHNIQUE: CT of the chest was performed without the administration of intravenous contrast. Multiplanar reformatted images are provided for review. Automated exposure control, iterative reconstruction, and/or weight based adjustment of the mA/kV was utilized to reduce the radiation dose to as low as reasonably achievable. COMPARISON: None available. CLINICAL HISTORY: Dyspnea, chronic, unclear etiology. FINDINGS: MEDIASTINUM: Heart: Extensive multivessel coronary artery calcification. Global cardiac size within  normal limits. No pericardial effusion. Central pulmonary arteries are of normal caliber. Mild atherosclerotic calcification within the thoracic aorta. No aortic aneurysm. The central airways are clear. No symptoms of obstructing lesion. LYMPH NODES: No mediastinal, hilar or axillary lymphadenopathy. LUNGS AND PLEURA: Small bibasilar cylindrical bronchiectasis and subpleural fibrotic change, likely post inflammatory in nature. No superimposed acute pulmonary infiltrate. No pulmonary edema. No pleural effusion or pneumothorax. SOFT TISSUES/BONES: Osseous structures are age appropriate with advanced degenerative changes within the thoracic spine and shoulders bilaterally. No acute bone abnormality. No lytic or blastic bone lesion. No acute abnormality of the soft tissues. UPPER ABDOMEN: Limited images of the upper abdomen demonstrates no acute abnormality. IMPRESSION: 1. No acute findings. 2. Mild bibasilar post-inflammatory change. No acute pulmonary infiltrate. 3. Extensive multivessel coronary artery calcification. Electronically signed by: Dorethia Molt MD 08/20/2024 05:58 PM EST RP Workstation: [id number]   CT Angio Chest PE W and/or Wo Contrast Result Date: 08/20/2024 CLINICAL DATA:  Pulmonary embolism (PE) suspected, high prob Central chest pain with shortness of breath and vomiting since yesterday. On home oxygen. EXAM: CT ANGIOGRAPHY CHEST WITH CONTRAST TECHNIQUE: Multidetector CT imaging of the chest was performed using the standard protocol during bolus  administration of intravenous contrast. Multiplanar CT image reconstructions and MIPs were obtained to evaluate the vascular anatomy. RADIATION DOSE REDUCTION: This exam was performed according to the departmental dose-optimization program which includes automated exposure control, adjustment of the mA and/or kV according to patient size and/or use of iterative reconstruction technique. CONTRAST:  75mL OMNIPAQUE  IOHEXOL  350 MG/ML SOLN COMPARISON:  Same  date chest radiographs.  Chest CTA 11/13/2015. FINDINGS: Cardiovascular: The pulmonary arteries are well opacified with contrast to the level of the segmental branches. There is no evidence of acute pulmonary embolism. Mild aortic and coronary artery atherosclerosis without acute systemic abnormalities. The heart size is stable at the upper limits of normal. Small amount of fluid in the superior pericardial recess, stable. Mediastinum/Nodes: There are no enlarged mediastinal, hilar or axillary lymph nodes. The thyroid gland, trachea and esophagus demonstrate no significant findings. Lungs/Pleura: No pleural effusion or pneumothorax. Mildly progressive scarring and subpleural reticulation at both lung bases. No confluent airspace disease. New small nonspecific 3 mm left upper lobe nodule on image 47/7. No suspicious pulmonary nodules. Upper abdomen: Mild reflux of contrast into the IVC and hepatic veins. No acute findings are demonstrated within the visualized upper abdomen. Musculoskeletal/Chest wall: There is no chest wall mass or suspicious osseous finding. Multilevel spondylosis. Review of the MIP images confirms the above findings. IMPRESSION: 1. No evidence of acute pulmonary embolism or other acute chest findings. 2. Mildly progressive scarring and subpleural reticulation at both lung bases. 3. New small nonspecific 3 mm left upper lobe pulmonary nodule. Per Fleischner Society Guidelines, if patient is low risk for malignancy, no routine follow-up imaging is recommended. If patient is high risk for malignancy, Smith Mcnicholas non-contrast Chest CT at 12 months is optional. If performed and the nodule is stable at 12 months, no further follow-up is recommended. These guidelines do not apply to immunocompromised patients and patients with cancer. Follow up in patients with significant comorbidities as clinically warranted. For lung cancer screening, adhere to Lung-RADS guidelines. Reference: Radiology. 2017; 284(1):228-43. 4.   Aortic Atherosclerosis (ICD10-I70.0). Electronically Signed   By: Elsie Perone M.D.   On: 08/20/2024 15:24   DG Chest 2 View Result Date: 08/20/2024 CLINICAL DATA:  cp/sob EXAM: CHEST - 2 VIEW COMPARISON:  04/07/2020, 11/13/2015 FINDINGS: Low lung volumes. Subtle, almost nodular airspace opacities in the right mid and right lower lung fields. No pleural effusion or pneumothorax. Mild cardiomegaly. No acute fracture or destructive lesions. Multilevel thoracic osteophytosis. Right humeral head rotator cuff anchor. IMPRESSION: Subtle, almost nodular airspace opacities in the right mid and right lower lung fields, which may be artifactual from overlapping bony and vascular structures. Alternatively, Toia Micale developing bronchopneumonia or underlying pulmonary nodules could have this appearance in the correct clinical context. Nonemergent chest CT may be of benefit for further characterization. Electronically Signed   By: Rogelia Myers M.D.   On: 08/20/2024 12:31    Microbiology: No results found for this or any previous visit (from the past 240 hours).   Labs: Basic Metabolic Panel: Recent Labs  Lab 08/20/24 1138 08/20/24 2350 08/22/24 0341  NA 139 138 136  K 4.1 4.0 3.6  CL 103 102 101  CO2 25 26 23   GLUCOSE 136* 154* 154*  BUN 14 14 13   CREATININE 0.86 0.87 0.94  CALCIUM  9.0 8.8* 8.7*  MG  --   --  1.9  PHOS  --   --  2.7   Liver Function Tests: Recent Labs  Lab 08/20/24 1138  AST 71*  ALT 16  ALKPHOS 67  BILITOT 0.8  PROT 6.5  ALBUMIN 3.4*   No results for input(s): LIPASE, AMYLASE in the last 168 hours. No results for input(s): AMMONIA in the last 168 hours. CBC: Recent Labs  Lab 08/20/24 1138 08/20/24 2350 08/22/24 0341 08/23/24 0505  WBC 5.1 6.4 9.1 7.2  HGB 13.8 13.4 13.8 13.1  HCT 42.1 40.0 41.0 38.8  MCV 95.5 93.9 93.6 93.3  PLT 261 254 256 241   Cardiac Enzymes: No results for input(s): CKTOTAL, CKMB, CKMBINDEX, TROPONINI in the last 168  hours. BNP: BNP (last 3 results) Recent Labs    08/20/24 1138  BNP 485.0*    ProBNP (last 3 results) No results for input(s): PROBNP in the last 8760 hours.  CBG: Recent Labs  Lab 08/21/24 2125 08/22/24 0806 08/22/24 1146 08/22/24 2117 08/23/24 0809  GLUCAP 186* 128* 282* 195* 139*       Signed:  Meliton Monte MD.  [facility] 08/23/2024, 10:40 AM

## 2024-08-23 NOTE — Evaluation (Signed)
 Occupational Therapy Evaluation Patient Details Name: Catherine Vazquez MRN: [id number] DOB: December 08, 1924 Today's Date: 08/23/2024   History of Present Illness   Pt is a 89 y.o. F presenting to [facility] on 08/20/24 w/ L sided chest pain lasting 10-15 mins x2 bouts, and SOB. Pt was found to have elevated troponins and was started on heparin . Pt admitted for further work-up. PMH is significant for CVA, HTN, HLD, DM-2, and chronic resp. failure on 2L..     Clinical Impressions Pt progressing well towards goals. Pt received in room with family, preparing for d/c. Pt completed dressing with CGA for balance and increased time. Continues to be limited by decreased strength requiring x2 trials for STS. Continue to recommend HHOT to optimize independence levels.       If plan is discharge home, recommend the following:   A little help with walking and/or transfers;A little help with bathing/dressing/bathroom;Assistance with cooking/housework;Assist for transportation      Equipment Recommendations   None recommended by OT      Precautions/Restrictions   Precautions Precautions: Fall Recall of Precautions/Restrictions: Intact Restrictions Weight Bearing Restrictions Per Provider Order: No     Mobility Bed Mobility Overal bed mobility: Needs Assistance Bed Mobility: Supine to Sit     Supine to sit: Supervision     General bed mobility comments: S for safety, no assist    Transfers Overall transfer level: Needs assistance Equipment used: None Transfers: Sit to/from Stand Sit to Stand: Contact guard assist           General transfer comment: Pt CGA for STS, required increased time and 2 trials to come to stand      Balance Overall balance assessment: Needs assistance Sitting-balance support: No upper extremity supported, Feet supported Sitting balance-Leahy Scale: Good     Standing balance support: Bilateral upper extremity supported, During functional activity,  Reliant on assistive device for balance Standing balance-Leahy Scale: Poor Standing balance comment: reliant on external support                           ADL either performed or assessed with clinical judgement   ADL Overall ADL's : Needs assistance/impaired     Upper Body Dressing : Set up;Sitting   Lower Body Dressing: Contact guard assist;Sit to/from stand       Toileting- Clothing Manipulation and Hygiene: Contact guard assist;Sit to/from stand       Functional mobility during ADLs: Contact guard assist General ADL Comments: Pt completing dressing activity with CGA, increased time and effort     Vision   Vision Assessment?: No apparent visual deficits            Pertinent Vitals/Pain Pain Assessment Pain Assessment: No/denies pain     Extremity/Trunk Assessment Upper Extremity Assessment Upper Extremity Assessment: Generalized weakness   Lower Extremity Assessment Lower Extremity Assessment: Defer to PT evaluation       Communication Communication Communication: Impaired Factors Affecting Communication: Hearing impaired   Cognition Arousal: Alert Behavior During Therapy: WFL for tasks assessed/performed Cognition: No apparent impairments     Following commands: Impaired Following commands impaired: Follows multi-step commands with increased time     Cueing  General Comments   Cueing Techniques: Verbal cues;Visual cues;Gestural cues  VSS on RA, family present throughout session        OT Problem List: Decreased strength;Decreased safety awareness;Decreased activity tolerance   OT Treatment/Interventions: Self-care/ADL training;Therapeutic exercise;Therapeutic activities;Patient/family education;Energy conservation  OT Goals(Current goals can be found in the care plan section)   Acute Rehab OT Goals OT Goal Formulation: With patient Time For Goal Achievement: 09/04/24 Potential to Achieve Goals: Good ADL Goals Pt Will  Perform Upper Body Bathing: with set-up Pt Will Perform Lower Body Bathing: with contact guard assist Pt Will Perform Upper Body Dressing: with set-up Pt Will Perform Lower Body Dressing: with contact guard assist Pt Will Transfer to Toilet: with modified independence   OT Frequency:  Min 2X/week       AM-PAC OT 6 Clicks Daily Activity     Outcome Measure Help from another person eating meals?: None Help from another person taking care of personal grooming?: A Little Help from another person toileting, which includes using toliet, bedpan, or urinal?: A Little Help from another person bathing (including washing, rinsing, drying)?: A Little Help from another person to put on and taking off regular upper body clothing?: A Little Help from another person to put on and taking off regular lower body clothing?: A Little 6 Click Score: 19   End of Session Nurse Communication: Mobility status  Activity Tolerance: Patient tolerated treatment well Patient left: in bed;with call bell/phone within reach;with family/visitor present  OT Visit Diagnosis: Unsteadiness on feet (R26.81);Muscle weakness (generalized) (M62.81)                Time: 8884-8870 OT Time Calculation (min): 14 min Charges:  OT General Charges $OT Visit: 1 Visit OT Treatments $Self Care/Home Management : 8-22 mins  Adrianne BROCKS, OT  Acute Rehabilitation Services Office [phone] Secure chat preferred   Adrianne GORMAN [employer]vers 08/23/2024, 11:51 AM

## 2024-08-23 NOTE — Progress Notes (Addendum)
 Progress Note  Patient Name: Catherine Vazquez Date of Encounter: 08/23/2024 [facility] Cardiologist: Alm Clay, MD   Interval Summary    Feels well, wants to go home.   Vital Signs Vitals:   08/22/24 2004 08/22/24 2358 08/23/24 0328 08/23/24 0812  BP: 129/81   131/60  Pulse:  79 63 74  Resp: 18 16 16 16   Temp: 98.6 F (37 C) 98.2 F (36.8 C) 97.8 F (36.6 C) 98.8 F (37.1 C)  TempSrc: Oral Oral Oral Oral  SpO2:  100% 100%   Weight:   69.8 kg   Height:        Intake/Output Summary (Last 24 hours) at 08/23/2024 0817 Last data filed at 08/23/2024 0328 Gross per 24 hour  Intake 81.81 ml  Output 500 ml  Net -418.19 ml      08/23/2024    3:28 AM 08/20/2024    8:56 PM 08/20/2024    3:50 PM  Last 3 Weights  Weight (lbs) 153 lb 14.1 oz 157 lb 6.5 oz 160 lb 4.4 oz  Weight (kg) 69.8 kg 71.4 kg 72.7 kg      Telemetry/ECG   Intermittent bradycardia with competing atrial pacemaker and mobitz1, blocked PACs and PVCs - Personally Reviewed  Physical Exam  GEN: No acute distress.   Neck: No JVD Cardiac: Irreg, soft systolic murmur, no rubs, or gallops.  Respiratory: Clear to auscultation bilaterally. GI: Soft, nontender, non-distended  MS: No edema  Assessment & Plan   89 y.o. female with a hx of type 2 diabetes, hypertension, prior CVA in 2018 and arthritis who was seen 08/20/2024 for the evaluation of NSTEMI at the request of Lynwood Moulder MD.  -- Presented with episodes of chest pain over the past week;    NSTEMI -- Peak troponin just over 7000.  Has remained chest pain-free during hospitalization -- Echocardiogram shows preserved EF with anterolateral hypokinesis suggesting probably either high OM/diagonal or ramus or medius disease. -- Based on discussion with patient and family, plan is for early noninvasive/conservative management with no plans for invasive procedures. -- Completed 48-hour IV heparin .  On aspirin  81 mg plus Plavix  75 mg (after  load 300 mg) DAPT -- continue Atorvastatin  40 mg, losartan  25mg  daily  -- Avoiding AV nodal agents due to conduction disease   Conduction disease -- EKG on admission possible second-degree AV block Mobitz type I with ventricular escape rhythm versus 2-1 AV block.  EKG was reviewed with EP felt that this was consistent with competing atrial pacemaker with underlying first-degree block and intermittent wenkebach.  = remains stable -- avoid AVN agents -- WILL plan OP Zio Patch ordered at initial f/u if EKG continues to show abnormalities.   Hyperlipidemia -- LDL 150, HDL 60 -- On atorvastatin  80 mg daily.  Ok to [employer] from cardiology standpoint, will arrange outpatient follow up  For questions or updates, please contact [facility] Please consult [email or web address] for contact info under   Signed, Manuelita Rummer, NP   ATTENDING ATTESTATION  I have seen, examined and evaluated the patient this AM on rounds along with Manuelita Rummer, NP-C.  After reviewing all the available data and chart, we discussed the patients laboratory, study & physical findings as well as symptoms in detail.  I agree with her findings, examination as well as impression recommendations as per our discussion.    Attending adjustments noted in italics.   Continues to do well with no major issues.  Has been stable throughout the hospitalization.  Echocardiogram relatively reassuring.  Agree with plan for short-term DAPT, can probably go to Plavix  alone after 3 months to complete 1 year and then return to aspirin .  Agree with the plan for avoiding AV nodal agents.  Due to the holiday weekend it is difficult to arrange outpatient Zio patch monitor, can this can be done in the outpatient setting once seen posthospital if the outpatient EKG continues to show abnormal findings similar to what is seen here.  No new medication changes beyond that noted above.    Alm MICAEL Clay, MD, MS Alm Clay, M.D.,  M.S. Interventional Cardiologist  [facility] Pager # [phone]

## 2024-08-23 NOTE — Progress Notes (Signed)
 Daily Progress Note   Patient Name: Catherine Vazquez       Date: 08/23/2024 DOB: 19-Jul-1925  Age: 89 y.o. MRN#: [id number] Attending Physician: Perri DELENA Meliton Mickey., * Primary Care Physician: Ilah Crigler, MD Admit Date: 08/20/2024  Reason for Consultation/Follow-up: Establishing goals of care  Subjective: Feels well,  no chest pain or SOB, wants to go home  Length of Stay: 3  Current Medications: Scheduled Meds:   aspirin  EC  81 mg Oral Daily   atorvastatin   40 mg Oral Daily   clopidogrel   75 mg Oral Daily   insulin  aspart  0-6 Units Subcutaneous TID WC   latanoprost   1 drop Both Eyes QHS   losartan   25 mg Oral Daily   sodium chloride  flush  3 mL Intravenous Q12H    Continuous Infusions:   PRN Meds: acetaminophen  **OR** acetaminophen , albuterol , morphine  injection, ondansetron  **OR** ondansetron  (ZOFRAN ) IV, mouth rinse, oxyCODONE , polyethylene glycol, sodium chloride  flush  Physical Exam Constitutional:      General: She is not in acute distress.    Appearance: She is ill-appearing.  Pulmonary:     Effort: Pulmonary effort is normal.  Skin:    General: Skin is warm and dry.  Neurological:     Mental Status: She is alert and oriented to person, place, and time.             Vital Signs: BP 131/60 (BP Location: Left Arm)   Pulse 74   Temp 98.8 F (37.1 C) (Oral)   Resp 16   Ht 5' 5 (1.651 m)   Wt 69.8 kg   SpO2 100%   BMI 25.61 kg/m  SpO2: SpO2: 100 % O2 Device: O2 Device: Room Air O2 Flow Rate: O2 Flow Rate (L/min): 1 L/min  Intake/output summary:  Intake/Output Summary (Last 24 hours) at 08/23/2024 1003 Last data filed at 08/23/2024 9671 Gross per 24 hour  Intake 81.81 ml  Output 500 ml  Net -418.19 ml   LBM: Last BM Date : 08/19/24 Baseline Weight:  Weight: 72.7 kg Most recent weight: Weight: 69.8 kg       Palliative Assessment/Data: PPS 50%      Patient Active Problem List   Diagnosis Date Noted   NSTEMI, initial episode of care (HCC) 08/20/2024   HLD (hyperlipidemia) 08/20/2024   DM (diabetes mellitus), type 2 (HCC) 08/20/2024   CHF with left ventricular diastolic dysfunction, NYHA class 2 (HCC) 09/13/2013   DOE (dyspnea on exertion) 09/12/2013   Fatigue 09/12/2013   HTN (hypertension) 09/12/2013   Arthritis     Palliative Care Assessment & Plan   HPI: 89 y.o. female  with past medical history of prior CVA, HTN, HLD, DM-2, chronic hypoxic respiratory failure on 2 L of oxygen at baseline  admitted on 08/20/2024 with left-sided chest pain. Diagnosed with NSTEMI, being treated medically.  PMT consulted for assistance with GOC discussions.   Assessment: Follow up today with patient, granddaughter Catherine Vazquez, and grandson Catherine Vazquez.   I, again, introduced Palliative Medicine as specialized medical care for people living with serious illness. It focuses on providing relief from the symptoms and stress of a serious illness. The goal is to improve quality of life for  both the patient and the family.  We discussed a brief life review of the patient. She reviews with me her grandchildren are her next of kin. She does have 2 sisters but her grandchildren are who help and advocate for her. We review her love of sports.    We discussed patient's current illness and what it means in the larger context of patient's on-going co-morbidities.  Discuss her NSTEMI and tx of it. Discuss plan to dc soon.   I attempted to elicit values and goals of care important to the patient.    The difference between aggressive medical intervention and comfort care was considered in light of the patient's goals of care. Patient is interested in conservative medical treatment.   Encouraged patient/family to consider DNR/DNI status understanding evidenced based  poor outcomes in similar hospitalized patients, as the cause of the arrest is likely associated with chronic/terminal disease rather than a reversible acute cardio-pulmonary event.   After discussion, patient confirms her wishes are for DNR/DNI. She clearly states this in front of her grandchildren who agree. She is open to medical treatment but not to CPR or ventilators. She tells me when it is her time she is ready and wants to go naturally and peacefully.   Questions and concerns were addressed. The family was encouraged to call with questions or concerns.    Recommendations/Plan: Code status changed to DNR/DNI - signed DNR given to granddaughter to place at home Likely dc today  Code Status: DNR  Care plan was discussed with patient, grandchildren, Dr Perri  Thank you for allowing the Palliative Medicine Team to assist in the care of this patient.   Total Time 45 minutes Prolonged Time Billed  no   Time spent includes: Detailed review of medical records (labs, imaging, vital signs), medically appropriate exam, discussion with treatment team, counseling and educating patient, family and/or staff, documenting clinical information, medication management and coordination of care.     *Please note that this is a verbal dictation therefore any spelling or grammatical errors are due to the Dragon Medical One system interpretation.  Tobey Jama Barnacle, DNP, [facility] Palliative Medicine Team Team Phone # [phone]  Pager [phone]

## 2024-08-23 NOTE — Care Management Important Message (Signed)
 Important Message  Patient Details  Name: LATASH NOURI MRN: [id number] Date of Birth: [birth year]   Important Message Given:  Yes - Medicare IM     Vonzell Arrie Sharps 08/23/2024, 11:48 AM

## 2024-08-23 NOTE — Plan of Care (Signed)

## 2024-08-23 NOTE — Progress Notes (Signed)
 Mobility Specialist: Progress Note   08/23/24 1100  Mobility  Activity Ambulated with assistance  Level of Assistance Minimal assist, patient does 75% or more  Assistive Device Front wheel walker  Distance Ambulated (ft) 75 ft  Activity Response Tolerated well  Mobility Referral Yes  Mobility visit 1 Mobility  Mobility Specialist Start Time (ACUTE ONLY) 0940  Mobility Specialist Stop Time (ACUTE ONLY) [id number]  Mobility Specialist Time Calculation (min) (ACUTE ONLY) 12 min    Pt received in bed, agreeable to mobility session. Grandson present. SV for bed mobility. ModA for STS with heavy posterior lean upon initial stand. Once corrected, pt was CGA for ambulation with min cues needed to widen BOS. No complaints, denies any dizziness or lightheadedness. Distance limited d/t BR urgency. Returned to room and sat on BR commode with elevated seat. Left in BR with all needs met, call bell in reach. Grandson present.   Ileana Lute Mobility Specialist Please contact via SecureChat or Rehab office at [phone]

## 2024-08-23 NOTE — Progress Notes (Signed)
 Discharge Nurse Summary: DC order noted per MD. DC RN at bedside with patient/family. Patient/family agreeable with discharge plan. AVS printed/reviewed. PIVs removed, skin intact. No DME needs. No home meds. TOC meds pending pickup. CP/Edu resolved. Telemonitor returned to charging station. All belongings accounted for. Patient wheeled downstairs by volunteer transport for discharge by private auto. TOC meds picked up on the way out.  Rosario EMERSON Lund, RN

## 2024-09-11 ENCOUNTER — Encounter: Payer: Self-pay | Admitting: Physician Assistant

## 2024-09-11 ENCOUNTER — Ambulatory Visit: Admitting: Physician Assistant

## 2024-09-11 VITALS — BP 120/60 | Ht 65.0 in | Wt 155.0 lb

## 2024-09-11 DIAGNOSIS — I441 Atrioventricular block, second degree: Secondary | ICD-10-CM

## 2024-09-11 DIAGNOSIS — I10 Essential (primary) hypertension: Secondary | ICD-10-CM

## 2024-09-11 DIAGNOSIS — I214 Non-ST elevation (NSTEMI) myocardial infarction: Secondary | ICD-10-CM

## 2024-09-11 DIAGNOSIS — E782 Mixed hyperlipidemia: Secondary | ICD-10-CM

## 2024-09-11 DIAGNOSIS — I5030 Unspecified diastolic (congestive) heart failure: Secondary | ICD-10-CM | POA: Diagnosis not present

## 2024-09-11 NOTE — Progress Notes (Signed)
 Cardiology Office Note   Date:  09/11/2024  ID:  Catherine Vazquez, Catherine Vazquez [birth year], MRN [id number] PCP: Catherine Crigler, MD  [facility] Providers Cardiologist:  Catherine Clay, MD     History of Present Illness Catherine Vazquez is a 89 y.o. female with past medical history of hypertension, hyperlipidemia, DM2, prior CVA in 2018 and arthritis.  Previous echocardiogram in April 2021 showed normal EF 60 to 65%, grade 1 DD, normal RV, normal pulmonary artery systolic pressure and normal valve function.  She was recently admitted to the [facility] in November 2025 with NSTEMI after presenting with chest pain.  Serial troponin was up to 7370.  proBNP 4850.  CTA of the chest shows no PE.  EKG showed sinus rhythm with left anterior fascicular block, right bundle branch block, heart rate in the 50s. Given her age, dementia and the lack of ongoing chest pain, conservative management was recommended with 48 hours of IV heparin .  Telemetry showed either secondary to type I or type II heart block, AV nodal blocking agent was avoided.  Echocardiogram showed preserved EF with anterolateral hypokinesis suggestive of possible either high OM/diagonal or ramus intermedius disease.  She was placed on aspirin  and Plavix .  Cardiology note mention possible outpatient heart monitor if her EKG continues to demonstrate secondary AV block.  Patient presents today for follow-up accompanied by grandson.  Since discharge, she has not had any recurrent chest pain.  She denies any dizziness, blurry vision or feeling of passing out.  EKG today showed resolution of second-degree heart block.  Given lack of symptoms, we will hold off on ordering outpatient heart monitor.  I will prescribe some sublingual nitroglycerin for this patient.  She is aware to contact cardiology service if she started having worsening fatigue, shortness of breath, chest discomfort, dizziness or feeling of passing out.  Otherwise, she appears to be euvolemic  without any lower extremity edema, orthopnea or PND.  ROS:   She denies chest pain, palpitations, dyspnea, pnd, orthopnea, n, v, dizziness, syncope, edema, weight gain, or early satiety. All other systems reviewed and are otherwise negative except as noted above.    Studies Reviewed EKG Interpretation Date/Time:  Wednesday September 11 2024 14:54:53 EST Ventricular Rate:  72 PR Interval:  264 QRS Duration:  120 QT Interval:  418 QTC Calculation: 457 R Axis:   258  Text Interpretation: Sinus rhythm with 1st degree A-V block Right bundle branch block Possible Lateral infarct , age undetermined When compared with ECG of 22-Aug-2024 11:38, Borderline criteria for Lateral infarct are now Present Minimal criteria for Inferior infarct are no longer Present ST no longer depressed in Anterior leads Confirmed by Catherine Vazquez [phone]) on 09/11/2024 2:56:06 PM    Cardiac Studies & Procedures   ______________________________________________________________________________________________     ECHOCARDIOGRAM  ECHOCARDIOGRAM COMPLETE 08/21/2024  Narrative ECHOCARDIOGRAM REPORT    Patient Name:   Catherine Vazquez Catherine Vazquez Date of Exam: 08/21/2024 Medical Rec #:  [id number]         Height:       65.0 in Accession #:    [id number]        Weight:       157.4 lb Date of Birth:  02-28-1925        BSA:          1.787 m Patient Age:    89 years          BP:           131/51 mmHg Patient  Gender: F                 HR:           62 bpm. Exam Location:  Inpatient  Procedure: 2D Echo, Cardiac Doppler and Color Doppler (Both Spectral and Color Flow Doppler were utilized during procedure).  Indications:    NSTEMI  History:        Patient has prior history of Echocardiogram examinations, most recent 01/02/2020. CHF, Acute MI, Signs/Symptoms:Dyspnea; Risk Factors:Hypertension, Diabetes and Dyslipidemia.  Sonographer:    Catherine Vazquez Referring Phys: [phone] Catherine Vazquez M [facility]   Sonographer Comments: No subcostal  window. IMPRESSIONS   1. Left ventricular ejection fraction, by estimation, is 60 to 65%. The left ventricle has normal function. The left ventricle demonstrates regional wall motion abnormalities. Appears hypokinesis of anterolateral wall but poor visualization; overall LV systolic function appears preserved. There is moderate left ventricular hypertrophy. Left ventricular diastolic parameters are consistent with Grade II diastolic dysfunction (pseudonormalization). Elevated left atrial pressure. 2. Right ventricular systolic function is normal. The right ventricular size is normal. There is severely elevated pulmonary artery systolic pressure. 3. The mitral valve is degenerative. Trivial mitral valve regurgitation. No evidence of mitral stenosis. Moderate mitral annular calcification. 4. The aortic valve is tricuspid. Aortic valve regurgitation is not visualized. Aortic valve sclerosis is present, with no evidence of aortic valve stenosis.  FINDINGS Left Ventricle: Left ventricular ejection fraction, by estimation, is 60 to 65%. The left ventricle has normal function. The left ventricle demonstrates regional wall motion abnormalities. The left ventricular internal cavity size was small. There is moderate left ventricular hypertrophy. Left ventricular diastolic parameters are consistent with Grade II diastolic dysfunction (pseudonormalization). Elevated left atrial pressure.  Right Ventricle: The right ventricular size is normal. No increase in right ventricular wall thickness. Right ventricular systolic function is normal. There is severely elevated pulmonary artery systolic pressure. The tricuspid regurgitant velocity is 3.74 m/s, and with an assumed right atrial pressure of 8 mmHg, the estimated right ventricular systolic pressure is 64.0 mmHg.  Left Atrium: Left atrial size was normal in size.  Right Atrium: Right atrial size was normal in size.  Pericardium: There is no evidence of  pericardial effusion.  Mitral Valve: The mitral valve is degenerative in appearance. Moderate mitral annular calcification. Trivial mitral valve regurgitation. No evidence of mitral valve stenosis.  Tricuspid Valve: The tricuspid valve is normal in structure. Tricuspid valve regurgitation is mild.  Aortic Valve: The aortic valve is tricuspid. Aortic valve regurgitation is not visualized. Aortic valve sclerosis is present, with no evidence of aortic valve stenosis.  Pulmonic Valve: The pulmonic valve was not well visualized. Pulmonic valve regurgitation is trivial.  Aorta: The aortic root and ascending aorta are structurally normal, with no evidence of dilitation.  IAS/Shunts: The interatrial septum was not well visualized.   LEFT VENTRICLE PLAX 2D LVIDd:         3.50 cm   Diastology LVIDs:         2.20 cm   LV e' medial:    5.43 cm/s LV PW:         1.30 cm   LV E/e' medial:  16.9 LV IVS:        1.00 cm   LV e' lateral:   7.62 cm/s LVOT diam:     2.00 cm   LV E/e' lateral: 12.0 LV SV:         54 LV SV Index:   30 LVOT Area:  3.14 cm   RIGHT VENTRICLE RV Basal diam:  3.50 cm RV Mid diam:    3.00 cm  LEFT ATRIUM             Index        RIGHT ATRIUM           Index LA Vol (A2C):   53.1 ml 29.72 ml/m  RA Area:     12.50 cm LA Vol (A4C):   26.5 ml 14.83 ml/m  RA Volume:   33.70 ml  18.86 ml/m LA Biplane Vol: 40.2 ml 22.50 ml/m AORTIC VALVE LVOT Vmax:   78.60 cm/s LVOT Vmean:  51.900 cm/s LVOT VTI:    0.173 m  AORTA Ao Root diam: 3.30 cm Ao Asc diam:  3.00 cm  MITRAL VALVE               TRICUSPID VALVE MV Area (PHT): 3.60 cm    TR Peak grad:   56.0 mmHg MV Decel Time: 211 msec    TR Vmax:        374.00 cm/s MV E velocity: 91.80 cm/s MV A velocity: 98.50 cm/s  SHUNTS MV E/A ratio:  0.93        Systemic VTI:  0.17 m Systemic Diam: 2.00 cm  Lonni Nanas MD Electronically signed by Lonni Nanas MD Signature Date/Time: 08/21/2024/10:12:58  AM    Final          ______________________________________________________________________________________________      Risk Assessment/Calculations          Physical Exam VS:  BP 120/60   Ht 5' 5 (1.651 m)   Wt 155 lb (70.3 kg)   BMI 25.79 kg/m        Wt Readings from Last 3 Encounters:  09/11/24 155 lb (70.3 kg)  08/23/24 153 lb 14.1 oz (69.8 kg)  11/17/21 167 lb (75.8 kg)    GEN: Well nourished, well developed in no acute distress NECK: No JVD; No carotid bruits CARDIAC: RRR, no murmurs, rubs, gallops RESPIRATORY:  Clear to auscultation without rales, wheezing or rhonchi  ABDOMEN: Soft, non-tender, non-distended EXTREMITIES:  No edema; No deformity   ASSESSMENT AND PLAN  NSTEMI: Patient was admitted recently to the [facility] with NSTEMI.  Troponin trended up to 7370.  proBNP 4850.  Given her advanced age and significant dementia, after discussing with family members, it was decided to proceed with medical management and defer invasive cardiac catheterization.  She completed 48 hours of IV heparin .  She was discharged on aspirin  and Plavix .  Plan to discontinue aspirin  on the next visit and to finish 1 year course of Plavix  before transition back to aspirin  monotherapy.  Second-degree AV block: Noted on initial EKG in the [facility].  This has resolved.  Will hold off on ordering a heart monitor.  However given her underlying right bundle branch block and left anterior fascicular block, she is at risk for further conduction disease in the future.  Family is aware to monitor for any signs of significant dizziness, blurry vision, or feeling of passing out.  If her heart rate at rest is below 50 bpm, family has been instructed to seek medical attention.  Avoid any AV nodal blocking agent  Hypertension: Blood pressure well-controlled  Hyperlipidemia: Continue atorvastatin .  Obtain fasting lipid panel and LFT in 1 month.        Dispo: Follow-up with Dr. Anner in 3 to 4  months.  Signed, Scot Ford, PA

## 2024-09-11 NOTE — Patient Instructions (Signed)
 Thank you for choosing [facility]!     Medication Instructions:  Nitroglycerin has been sent to your pharmacy. Take as needed for chest pain.  If you are feeling dizzy, fatigue or feel as if you will faint, check your heart rate. If under 50 beats per minute, call the office at [phone].  *If you need a refill on your cardiac medications before your next appointment, please call your pharmacy*   Lab Work: Return in one month for fasting labs If you have labs (blood work) drawn today and your tests are completely normal, you will receive your results only by: MyChart Message (if you have MyChart) OR A paper copy in the mail If you have any lab test that is abnormal or we need to change your treatment, we will call you to review the results.   Testing/Procedures: No procedures were ordered during today's visit.   Your next appointment:   3-4 month(s)   Provider:   Alm Clay, MD     Follow-Up: At [facility], you and your health needs are our priority.  As part of our continuing mission to provide you with exceptional heart care, we have created designated Provider Care Teams.  These Care Teams include your primary Cardiologist (physician) and Advanced Practice Providers (APPs -  Physician Assistants and Nurse Practitioners) who all work together to provide you with the care you need, when you need it. We recommend signing up for the patient portal called MyChart.  Sign up information is provided on this After Visit Summary.  MyChart is used to connect with patients for Virtual Visits (Telemedicine).  Patients are able to view lab/test results, encounter notes, upcoming appointments, etc.  Non-urgent messages can be sent to your provider as well.   To learn more about what you can do with MyChart, go to [email or web address].
